# Patient Record
Sex: Female | Born: 1990 | Race: White | Hispanic: No | Marital: Single | Smoking: Former smoker
Health system: Southern US, Community
[De-identification: ages and names within clinical notes are randomized; demographics above are authoritative.]

## PROBLEM LIST (undated history)

## (undated) ENCOUNTER — Inpatient Hospital Stay: Payer: Self-pay

## (undated) DIAGNOSIS — K8020 Calculus of gallbladder without cholecystitis without obstruction: Secondary | ICD-10-CM

## (undated) DIAGNOSIS — K219 Gastro-esophageal reflux disease without esophagitis: Secondary | ICD-10-CM

## (undated) DIAGNOSIS — F329 Major depressive disorder, single episode, unspecified: Secondary | ICD-10-CM

## (undated) DIAGNOSIS — F32A Depression, unspecified: Secondary | ICD-10-CM

---

## 2011-04-30 NOTE — L&D Delivery Note (Signed)
Patient was C/C/+3 and pushed for 20 minutes with epidural.   NSVD  female infant, Apgars 8,9, weight 5#4.   The patient had 3 lacerations - two 2nd degree labial repaired with 3-0 vicryl R with cath in place  and one 2nd degree perineal repaired with 2-0 vicryl R. Fundus was firm. EBL was expected. Placenta was delivered intact. Vagina was clear.  Baby was vigorous to bedside.  Jerone Cudmore A

## 2011-06-10 LAB — OB RESULTS CONSOLE RPR: RPR: NONREACTIVE

## 2011-06-10 LAB — OB RESULTS CONSOLE GC/CHLAMYDIA: Chlamydia: NEGATIVE

## 2011-06-10 LAB — OB RESULTS CONSOLE ABO/RH: RH Type: POSITIVE

## 2011-07-13 DIAGNOSIS — K8020 Calculus of gallbladder without cholecystitis without obstruction: Secondary | ICD-10-CM | POA: Insufficient documentation

## 2012-01-05 ENCOUNTER — Encounter (HOSPITAL_COMMUNITY): Payer: Self-pay | Admitting: *Deleted

## 2012-01-05 ENCOUNTER — Inpatient Hospital Stay (HOSPITAL_COMMUNITY)
Admission: AD | Admit: 2012-01-05 | Discharge: 2012-01-05 | Disposition: A | Discharge status: Home / Self Care | Payer: Medicaid Other | Source: Ambulatory Visit | Attending: Obstetrics & Gynecology | Admitting: Obstetrics & Gynecology

## 2012-01-05 DIAGNOSIS — Z3689 Encounter for other specified antenatal screening: Secondary | ICD-10-CM

## 2012-01-05 DIAGNOSIS — M545 Low back pain, unspecified: Secondary | ICD-10-CM | POA: Insufficient documentation

## 2012-01-05 DIAGNOSIS — O368190 Decreased fetal movements, unspecified trimester, not applicable or unspecified: Secondary | ICD-10-CM | POA: Insufficient documentation

## 2012-01-05 DIAGNOSIS — O479 False labor, unspecified: Secondary | ICD-10-CM

## 2012-01-05 DIAGNOSIS — O4700 False labor before 37 completed weeks of gestation, unspecified trimester: Secondary | ICD-10-CM | POA: Insufficient documentation

## 2012-01-05 HISTORY — DX: Major depressive disorder, single episode, unspecified: F32.9

## 2012-01-05 HISTORY — DX: Calculus of gallbladder without cholecystitis without obstruction: K80.20

## 2012-01-05 HISTORY — DX: Depression, unspecified: F32.A

## 2012-01-05 LAB — URINALYSIS, ROUTINE W REFLEX MICROSCOPIC
Nitrite: NEGATIVE
Specific Gravity, Urine: 1.02 (ref 1.005–1.030)
pH: 7 (ref 5.0–8.0)

## 2012-01-05 LAB — URINE MICROSCOPIC-ADD ON

## 2012-01-05 NOTE — MAU Note (Signed)
Pt reports not feeling baby move much today. Reports having some back pain on and off all.

## 2012-01-05 NOTE — MAU Provider Note (Signed)
History     CSN: [id number]  Arrival date and time: 01/05/12 1252   First Provider Initiated Contact with Patient 01/05/12 1404      Chief Complaint  Patient presents with  . Decreased Fetal Movement   HPI 21 y.o. G1P0 at [redacted]w[redacted]d with decreased fetal movement today. Also c/o intermittent low back pain that wraps around to belly today, decreased now. No bleeding, no LOF.    Past Medical History  Diagnosis Date  . Gall stones     dx/'d right after + UPT  . Depression     took Prozac in past    Past Surgical History  Procedure Date  . No past surgeries     History reviewed. No pertinent family history.  History  Substance Use Topics  . Smoking status: Former Smoker -- 0.2 packs/day for 3 years    Types: Cigarettes    Quit date: 07/05/2011  . Smokeless tobacco: Not on file  . Alcohol Use: No     h/o EtOH use, but not now    Allergies: No Known Allergies  Prescriptions prior to admission  Medication Sig Dispense Refill  . acetaminophen (TYLENOL) 325 MG tablet Take 650 mg by mouth every 6 (six) hours as needed. For gall stone pain.      . Prenatal Vit-Fe Fumarate-FA (PRENATAL MULTIVITAMIN) TABS Take 1 tablet by mouth daily.        Review of Systems  Constitutional: Negative.   Respiratory: Negative.   Cardiovascular: Negative.   Gastrointestinal: Negative for nausea, vomiting, abdominal pain, diarrhea and constipation.  Genitourinary: Negative for dysuria, urgency, frequency, hematuria and flank pain.       Negative for vaginal bleeding, Positive cramping/contractions  Musculoskeletal: Positive for back pain.  Neurological: Negative.   Psychiatric/Behavioral: Negative.    Physical Exam   Blood pressure 122/83, pulse 77, temperature 98.1 F (36.7 C), temperature source Oral, resp. rate 18, height 5\' 2"  (1.575 m), weight 167 lb 3.2 oz (75.841 kg).  Physical Exam  Nursing note and vitals reviewed. Constitutional: She is oriented to person, place, and time. She  appears well-developed and well-nourished. No distress.  Respiratory: Effort normal.  GI: Soft. There is no tenderness.  Genitourinary:       JYN:WGNFAO/13/-0/QMVHQIONG  Musculoskeletal: Normal range of motion.  Neurological: She is alert and oriented to person, place, and time.  Skin: Skin is warm and dry.  Psychiatric: She has a normal mood and affect.    MAU Course  Procedures Results for orders placed during the hospital encounter of 01/05/12 (from the past 24 hour(s))  URINALYSIS, ROUTINE W REFLEX MICROSCOPIC     Status: Abnormal   Collection Time   01/05/12  1:08 PM      Component Value Range   Color, Urine YELLOW  YELLOW   APPearance CLEAR  CLEAR   Specific Gravity, Urine 1.020  1.005 - 1.030   pH 7.0  5.0 - 8.0   Glucose, UA NEGATIVE  NEGATIVE mg/dL   Hgb urine dipstick NEGATIVE  NEGATIVE   Bilirubin Urine NEGATIVE  NEGATIVE   Ketones, ur NEGATIVE  NEGATIVE mg/dL   Protein, ur NEGATIVE  NEGATIVE mg/dL   Urobilinogen, UA 0.2  0.0 - 1.0 mg/dL   Nitrite NEGATIVE  NEGATIVE   Leukocytes, UA SMALL (*) NEGATIVE  URINE MICROSCOPIC-ADD ON     Status: Abnormal   Collection Time   01/05/12  1:08 PM      Component Value Range   Squamous Epithelial / LPF FEW (*)  RARE   WBC, UA 3-6  <3 WBC/hpf   RBC / HPF 0-2  <3 RBC/hpf   Bacteria, UA FEW (*) RARE   NST reactive, UC q 2-5 mintues (pt is not feeling pain with these contractions)  Assessment and Plan  20 y.o. G1P0 at [redacted]w[redacted]d  Decreased fetal movement - reactive NST Preterm contractions - no evidence of active labor D/C home with precautions, f/u as scheduled or sooner PRN  Kimberly Haas 01/05/2012, 2:04 PM s

## 2012-01-05 NOTE — MAU Note (Signed)
"  I haven't felt the baby move since yesterday until one time at 2100.  I got really scared when I didn't feel anything today.  I've been having pains on and off all week, but they got really bad today."

## 2012-01-12 ENCOUNTER — Encounter (HOSPITAL_COMMUNITY): Payer: Self-pay | Admitting: *Deleted

## 2012-01-12 ENCOUNTER — Encounter (HOSPITAL_COMMUNITY): Payer: Self-pay | Admitting: Anesthesiology

## 2012-01-12 ENCOUNTER — Inpatient Hospital Stay (HOSPITAL_COMMUNITY)
Admission: AD | Admit: 2012-01-12 | Discharge: 2012-01-15 | DRG: 775 | Disposition: A | Discharge status: Home / Self Care | Payer: Medicaid Other | Source: Ambulatory Visit | Attending: Obstetrics and Gynecology | Admitting: Obstetrics and Gynecology

## 2012-01-12 ENCOUNTER — Inpatient Hospital Stay (HOSPITAL_COMMUNITY): Payer: Medicaid Other | Admitting: Anesthesiology

## 2012-01-12 DIAGNOSIS — O99892 Other specified diseases and conditions complicating childbirth: Secondary | ICD-10-CM | POA: Diagnosis present

## 2012-01-12 DIAGNOSIS — Z3480 Encounter for supervision of other normal pregnancy, unspecified trimester: Secondary | ICD-10-CM

## 2012-01-12 DIAGNOSIS — Z22330 Carrier of Group B streptococcus: Secondary | ICD-10-CM

## 2012-01-12 DIAGNOSIS — D649 Anemia, unspecified: Secondary | ICD-10-CM | POA: Diagnosis not present

## 2012-01-12 DIAGNOSIS — O4290 Premature rupture of membranes, unspecified as to length of time between rupture and onset of labor, unspecified weeks of gestation: Secondary | ICD-10-CM | POA: Diagnosis present

## 2012-01-12 DIAGNOSIS — O9903 Anemia complicating the puerperium: Secondary | ICD-10-CM | POA: Diagnosis not present

## 2012-01-12 LAB — CBC
Hemoglobin: 11.9 g/dL — ABNORMAL LOW (ref 12.0–15.0)
MCH: 31.9 pg (ref 26.0–34.0)
RBC: 3.73 MIL/uL — ABNORMAL LOW (ref 3.87–5.11)

## 2012-01-12 LAB — RPR: RPR Ser Ql: NONREACTIVE

## 2012-01-12 MED ORDER — OXYTOCIN 40 UNITS IN LACTATED RINGERS INFUSION - SIMPLE MED: 62.5000 mL/h | Freq: Once | INTRAVENOUS | Status: DC

## 2012-01-12 MED ORDER — BUTORPHANOL TARTRATE 2 MG/ML IJ SOLN
2.0000 mg | INTRAMUSCULAR | Status: DC | PRN
  Administered 2012-01-12: 2 mg via INTRAVENOUS
  Filled 2012-01-12: qty 2

## 2012-01-12 MED ORDER — IBUPROFEN 600 MG PO TABS: 600.0000 mg | ORAL_TABLET | Freq: Four times a day (QID) | ORAL | Status: DC | PRN

## 2012-01-12 MED ORDER — OXYTOCIN 40 UNITS IN LACTATED RINGERS INFUSION - SIMPLE MED
1.0000 m[IU]/min | INTRAVENOUS | Status: DC
  Administered 2012-01-12: 2 m[IU]/min via INTRAVENOUS
  Filled 2012-01-12 (×2): qty 1000

## 2012-01-12 MED ORDER — PENICILLIN G POTASSIUM 5000000 UNITS IJ SOLR
2.5000 10*6.[IU] | INTRAVENOUS | Status: DC
  Administered 2012-01-12 – 2012-01-13 (×5): 2.5 10*6.[IU] via INTRAVENOUS
  Filled 2012-01-12 (×8): qty 2.5

## 2012-01-12 MED ORDER — LACTATED RINGERS IV SOLN: INTRAVENOUS | Status: DC

## 2012-01-12 MED ORDER — DIPHENHYDRAMINE HCL 50 MG/ML IJ SOLN: 12.5000 mg | INTRAMUSCULAR | Status: DC | PRN

## 2012-01-12 MED ORDER — FENTANYL 2.5 MCG/ML BUPIVACAINE 1/10 % EPIDURAL INFUSION (WH - ANES)
14.0000 mL/h | INTRAMUSCULAR | Status: DC
  Administered 2012-01-12 – 2012-01-13 (×3): 14 mL/h via EPIDURAL
  Filled 2012-01-12 (×3): qty 60

## 2012-01-12 MED ORDER — LIDOCAINE HCL (PF) 1 % IJ SOLN
30.0000 mL | INTRAMUSCULAR | Status: DC | PRN
  Administered 2012-01-13: 30 mL via SUBCUTANEOUS
  Filled 2012-01-12: qty 30

## 2012-01-12 MED ORDER — ACETAMINOPHEN 325 MG PO TABS: 650.0000 mg | ORAL_TABLET | ORAL | Status: DC | PRN

## 2012-01-12 MED ORDER — OXYTOCIN BOLUS FROM INFUSION
500.0000 mL | Freq: Once | INTRAVENOUS | Status: DC
  Filled 2012-01-12: qty 500

## 2012-01-12 MED ORDER — PENICILLIN G POTASSIUM 5000000 UNITS IJ SOLR
5.0000 10*6.[IU] | Freq: Once | INTRAVENOUS | Status: AC
  Administered 2012-01-12: 5 10*6.[IU] via INTRAVENOUS
  Filled 2012-01-12: qty 5

## 2012-01-12 MED ORDER — PHENYLEPHRINE 40 MCG/ML (10ML) SYRINGE FOR IV PUSH (FOR BLOOD PRESSURE SUPPORT)
80.0000 ug | PREFILLED_SYRINGE | INTRAVENOUS | Status: DC | PRN
  Filled 2012-01-12: qty 5

## 2012-01-12 MED ORDER — LACTATED RINGERS IV SOLN
500.0000 mL | INTRAVENOUS | Status: DC | PRN
  Administered 2012-01-12: 500 mL via INTRAVENOUS

## 2012-01-12 MED ORDER — LIDOCAINE HCL (PF) 1 % IJ SOLN
INTRAMUSCULAR | Status: DC | PRN
  Administered 2012-01-12 (×2): 5 mL

## 2012-01-12 MED ORDER — BUTORPHANOL TARTRATE 1 MG/ML IJ SOLN
1.0000 mg | INTRAMUSCULAR | Status: DC | PRN
  Administered 2012-01-12 (×2): 1 mg via INTRAVENOUS
  Filled 2012-01-12: qty 1

## 2012-01-12 MED ORDER — OXYCODONE-ACETAMINOPHEN 5-325 MG PO TABS: 1.0000 | ORAL_TABLET | ORAL | Status: DC | PRN

## 2012-01-12 MED ORDER — ONDANSETRON HCL 4 MG/2ML IJ SOLN
4.0000 mg | Freq: Four times a day (QID) | INTRAMUSCULAR | Status: DC | PRN
  Administered 2012-01-13: 4 mg via INTRAVENOUS
  Filled 2012-01-12: qty 2

## 2012-01-12 MED ORDER — PHENYLEPHRINE 40 MCG/ML (10ML) SYRINGE FOR IV PUSH (FOR BLOOD PRESSURE SUPPORT): 80.0000 ug | PREFILLED_SYRINGE | INTRAVENOUS | Status: DC | PRN

## 2012-01-12 MED ORDER — CITRIC ACID-SODIUM CITRATE 334-500 MG/5ML PO SOLN: 30.0000 mL | ORAL | Status: DC | PRN

## 2012-01-12 MED ORDER — LACTATED RINGERS IV SOLN: 500.0000 mL | Freq: Once | INTRAVENOUS | Status: DC

## 2012-01-12 MED ORDER — TERBUTALINE SULFATE 1 MG/ML IJ SOLN: 0.2500 mg | Freq: Once | INTRAMUSCULAR | Status: AC | PRN

## 2012-01-12 MED ORDER — EPHEDRINE 5 MG/ML INJ
10.0000 mg | INTRAVENOUS | Status: DC | PRN
  Filled 2012-01-12: qty 4

## 2012-01-12 MED ORDER — EPHEDRINE 5 MG/ML INJ: 10.0000 mg | INTRAVENOUS | Status: DC | PRN

## 2012-01-12 MED ORDER — BUTORPHANOL TARTRATE 1 MG/ML IJ SOLN
INTRAMUSCULAR | Status: AC
  Filled 2012-01-12: qty 1

## 2012-01-12 NOTE — MAU Note (Signed)
Leaking fld since Weds. But not a lot. Tonight leaking more fld. No pain but some pelvic pressure.

## 2012-01-12 NOTE — Anesthesia Preprocedure Evaluation (Signed)
Anesthesia Evaluation  Patient identified by MRN, date of birth, ID band Patient awake    Reviewed: Allergy & Precautions, H&P , Patient's Chart, lab work & pertinent test results  Airway Mallampati: II TM Distance: >3 FB Neck ROM: full    Dental No notable dental hx.    Pulmonary neg pulmonary ROS,  breath sounds clear to auscultation  Pulmonary exam normal       Cardiovascular negative cardio ROS  Rhythm:regular Rate:Normal     Neuro/Psych negative neurological ROS  negative psych ROS   GI/Hepatic negative GI ROS, Neg liver ROS,   Endo/Other  negative endocrine ROS  Renal/GU negative Renal ROS     Musculoskeletal   Abdominal   Peds  Hematology negative hematology ROS (+)   Anesthesia Other Findings  Gall stones   dx/'d right after + UPT Depression   took Prozac in past   Reproductive/Obstetrics (+) Pregnancy                           Anesthesia Physical Anesthesia Plan  ASA: II  Anesthesia Plan: Epidural   Post-op Pain Management:    Induction:   Airway Management Planned:   Additional Equipment:   Intra-op Plan:   Post-operative Plan:   Informed Consent: I have reviewed the patients History and Physical, chart, labs and discussed the procedure including the risks, benefits and alternatives for the proposed anesthesia with the patient or authorized representative who has indicated his/her understanding and acceptance.     Plan Discussed with:   Anesthesia Plan Comments:         Anesthesia Quick Evaluation

## 2012-01-12 NOTE — H&P (Signed)
Pt is a 21 year old white female, G1P0, at 36 weeks who presents to ER complaining of SROM. PNC was uncomplicated. +GBS. In ER patient had a + Amniosure, +Pool,+Fern. PMHx: see [facility]. PE: VSSAF        HEENT-wnl        ABD-gravid        FHTs- reactive. IMP/ IUP at 36 weeks with SROM         +GBS Plan/ Admit          Start PCN.

## 2012-01-12 NOTE — MAU Note (Signed)
Asked pt about home situation- parents supportive and helpful.  FOB is ?(possible 2, had ended relationship and then did not get period...neither are involved.

## 2012-01-12 NOTE — MAU Provider Note (Signed)
S:  20 y.o. G1P0 @[redacted]w[redacted]d  presents to MAU with LOF x4 days.  Pt reports constant back pain, denies regular contractions or cramping.  She reports good fetal movement,  vaginal bleeding, vaginal itching/burning, urinary symptoms, h/a, dizziness, n/v, or fever/chills.    O: Patient Vitals for the past 24 hrs:  BP Temp Temp src Pulse Resp Height Weight  01/12/12 0636 106/63 mmHg - - 87  - - -  01/12/12 0626 110/64 mmHg 97.7 F (36.5 C) Oral 58  20  5\' 2"  (1.575 m) 77.282 kg (170 lb 6 oz)  01/12/12 0606 132/101 mmHg 97.8 F (36.6 C) - 67  20  5\' 2"  (1.575 m) 77.384 kg (170 lb 9.6 oz)   Speculum exam:  Positive pooling, slide taken for ferning Cervix visually closed and long  Ferning positive  FHR 125, moderate variability, positive accels, negative decels No contractions noted on toco  A:  PPROM @36 .3   P: RN to call Dr Dareen Piano Admit to birthing suites  Sharen Counter Certified Nurse-Midwife

## 2012-01-12 NOTE — Anesthesia Procedure Notes (Signed)
Epidural Patient location during procedure: OB Start time: 01/12/2012 11:37 PM  Staffing Anesthesiologist: Brayton Caves R Performed by: anesthesiologist   Preanesthetic Checklist Completed: patient identified, site marked, surgical consent, pre-op evaluation, timeout performed, IV checked, risks and benefits discussed and monitors and equipment checked  Epidural Patient position: sitting Prep: site prepped and draped and DuraPrep Patient monitoring: continuous pulse ox and blood pressure Approach: midline Injection technique: LOR air and LOR saline  Needle:  Needle type: Tuohy  Needle gauge: 17 G Needle length: 9 cm and 9 Needle insertion depth: 5 cm cm Catheter type: closed end flexible Catheter size: 19 Gauge Catheter at skin depth: 10 cm Test dose: negative  Assessment Events: blood not aspirated, injection not painful, no injection resistance, negative IV test and no paresthesia  Additional Notes Patient identified.  Risk benefits discussed including failed block, incomplete pain control, headache, nerve damage, paralysis, blood pressure changes, nausea, vomiting, reactions to medication both toxic or allergic, and postpartum back pain.  Patient expressed understanding and wished to proceed.  All questions were answered.  Sterile technique used throughout procedure and epidural site dressed with sterile barrier dressing. No paresthesia or other complications noted.The patient did not experience any signs of intravascular injection such as tinnitus or metallic taste in mouth nor signs of intrathecal spread such as rapid motor block. Please see nursing notes for vital signs.

## 2012-01-12 NOTE — Progress Notes (Signed)
[redacted]w[redacted]d with PPROM since last night per pt.  Tested positive for ROM today.  FHTs 120s gstv, NST R Toco irreg SVE forebag broken, 3/80/-2 IUPC placed.  Continue pit.

## 2012-01-13 ENCOUNTER — Encounter (HOSPITAL_COMMUNITY): Payer: Self-pay | Admitting: *Deleted

## 2012-01-13 LAB — ABO/RH: ABO/RH(D): O POS

## 2012-01-13 MED ORDER — IBUPROFEN 800 MG PO TABS
800.0000 mg | ORAL_TABLET | Freq: Three times a day (TID) | ORAL | Status: DC
  Administered 2012-01-13 – 2012-01-15 (×6): 800 mg via ORAL
  Filled 2012-01-13 (×6): qty 1

## 2012-01-13 MED ORDER — METHYLERGONOVINE MALEATE 0.2 MG/ML IJ SOLN: 0.2000 mg | INTRAMUSCULAR | Status: DC | PRN

## 2012-01-13 MED ORDER — WITCH HAZEL-GLYCERIN EX PADS: 1.0000 "application " | MEDICATED_PAD | CUTANEOUS | Status: DC | PRN

## 2012-01-13 MED ORDER — METHYLERGONOVINE MALEATE 0.2 MG/ML IJ SOLN
INTRAMUSCULAR | Status: AC
  Administered 2012-01-13: 0.2 mg
  Filled 2012-01-13: qty 1

## 2012-01-13 MED ORDER — TETANUS-DIPHTH-ACELL PERTUSSIS 5-2.5-18.5 LF-MCG/0.5 IM SUSP
0.5000 mL | Freq: Once | INTRAMUSCULAR | Status: AC
  Administered 2012-01-14: 0.5 mL via INTRAMUSCULAR
  Filled 2012-01-13: qty 0.5

## 2012-01-13 MED ORDER — FERROUS SULFATE 325 (65 FE) MG PO TABS
325.0000 mg | ORAL_TABLET | Freq: Two times a day (BID) | ORAL | Status: DC
  Administered 2012-01-13 – 2012-01-14 (×2): 325 mg via ORAL
  Filled 2012-01-13 (×2): qty 1

## 2012-01-13 MED ORDER — SODIUM CHLORIDE 0.9 % IV SOLN: 250.0000 mL | INTRAVENOUS | Status: DC | PRN

## 2012-01-13 MED ORDER — METHYLERGONOVINE MALEATE 0.2 MG PO TABS: 0.2000 mg | ORAL_TABLET | ORAL | Status: DC | PRN

## 2012-01-13 MED ORDER — BENZOCAINE-MENTHOL 20-0.5 % EX AERO
1.0000 "application " | INHALATION_SPRAY | CUTANEOUS | Status: DC | PRN
  Administered 2012-01-15: 1 via TOPICAL
  Filled 2012-01-13: qty 56

## 2012-01-13 MED ORDER — DIPHENHYDRAMINE HCL 25 MG PO CAPS: 25.0000 mg | ORAL_CAPSULE | Freq: Four times a day (QID) | ORAL | Status: DC | PRN

## 2012-01-13 MED ORDER — ZOLPIDEM TARTRATE 5 MG PO TABS: 5.0000 mg | ORAL_TABLET | Freq: Every evening | ORAL | Status: DC | PRN

## 2012-01-13 MED ORDER — DIBUCAINE 1 % RE OINT: 1.0000 "application " | TOPICAL_OINTMENT | RECTAL | Status: DC | PRN

## 2012-01-13 MED ORDER — LANOLIN HYDROUS EX OINT: TOPICAL_OINTMENT | CUTANEOUS | Status: DC | PRN

## 2012-01-13 MED ORDER — OXYCODONE-ACETAMINOPHEN 5-325 MG PO TABS
1.0000 | ORAL_TABLET | ORAL | Status: DC | PRN
  Administered 2012-01-14 – 2012-01-15 (×4): 1 via ORAL
  Filled 2012-01-13 (×4): qty 1

## 2012-01-13 MED ORDER — PRENATAL MULTIVITAMIN CH
1.0000 | ORAL_TABLET | Freq: Every day | ORAL | Status: DC
  Administered 2012-01-13 – 2012-01-15 (×3): 1 via ORAL
  Filled 2012-01-13 (×3): qty 1

## 2012-01-13 MED ORDER — SENNOSIDES-DOCUSATE SODIUM 8.6-50 MG PO TABS
2.0000 | ORAL_TABLET | Freq: Every day | ORAL | Status: DC
  Administered 2012-01-13 – 2012-01-14 (×2): 2 via ORAL

## 2012-01-13 MED ORDER — SIMETHICONE 80 MG PO CHEW: 80.0000 mg | CHEWABLE_TABLET | ORAL | Status: DC | PRN

## 2012-01-13 MED ORDER — ONDANSETRON HCL 4 MG/2ML IJ SOLN: 4.0000 mg | INTRAMUSCULAR | Status: DC | PRN

## 2012-01-13 MED ORDER — MEASLES, MUMPS & RUBELLA VAC ~~LOC~~ INJ: 0.5000 mL | INJECTION | Freq: Once | SUBCUTANEOUS | Status: DC

## 2012-01-13 MED ORDER — MAGNESIUM HYDROXIDE 400 MG/5ML PO SUSP: 30.0000 mL | ORAL | Status: DC | PRN

## 2012-01-13 MED ORDER — SODIUM CHLORIDE 0.9 % IJ SOLN: 3.0000 mL | Freq: Two times a day (BID) | INTRAMUSCULAR | Status: DC

## 2012-01-13 MED ORDER — ONDANSETRON HCL 4 MG PO TABS
4.0000 mg | ORAL_TABLET | ORAL | Status: DC | PRN
  Administered 2012-01-14: 4 mg via ORAL
  Filled 2012-01-13: qty 1

## 2012-01-13 MED ORDER — SODIUM CHLORIDE 0.9 % IJ SOLN: 3.0000 mL | INTRAMUSCULAR | Status: DC | PRN

## 2012-01-13 NOTE — Anesthesia Postprocedure Evaluation (Signed)
  Anesthesia Post-op Note  Patient: Kimberly Haas  Procedure(s) Performed: * No surgery found *  Patient Location: Mother/Baby  Anesthesia Type: Epidural  Level of Consciousness: awake and alert   Airway and Oxygen Therapy: Patient Spontanous Breathing  Post-op Pain: none  Post-op Assessment: Patient's Cardiovascular Status Stable, Respiratory Function Stable, Patent Airway, No signs of Nausea or vomiting, Adequate PO intake, Pain level controlled, No headache, No backache, No residual numbness and No residual motor weakness  Post-op Vital Signs: Reviewed and stable  Complications: No apparent anesthesia complications

## 2012-01-14 LAB — CBC
Platelets: 231 10*3/uL (ref 150–400)
RDW: 13.3 % (ref 11.5–15.5)
WBC: 15.1 10*3/uL — ABNORMAL HIGH (ref 4.0–10.5)

## 2012-01-14 MED ORDER — FERROUS SULFATE 325 (65 FE) MG PO TABS
325.0000 mg | ORAL_TABLET | Freq: Three times a day (TID) | ORAL | Status: DC
  Administered 2012-01-14 – 2012-01-15 (×4): 325 mg via ORAL
  Filled 2012-01-14 (×4): qty 1

## 2012-01-14 NOTE — Progress Notes (Signed)
UR Chart review completed.  

## 2012-01-14 NOTE — Progress Notes (Signed)
Patient is eating, ambulating, voiding.  Pain control is good. Appropriate lochia.  No N/V.  No other complaints but sore nipples.  Filed Vitals:   01/13/12 1000 01/13/12 1400 01/13/12 2130 01/14/12 0550  BP: 115/75 119/80 119/78 101/58  Pulse: 75 96 74 69  Temp:   98.1 F (36.7 C) 97.5 F (36.4 C)  TempSrc:   Oral Oral  Resp: 18 18 18 18   Height:      Weight:        Fundus firm No CT Lab Results  Component Value Date   WBC 15.1* 01/14/2012   HGB 7.6* 01/14/2012   HCT 21.6* 01/14/2012   MCV 92.7 01/14/2012   PLT 231 01/14/2012    --/--/O POS (09/15 0910)/RI  A/P Post partum day 1 Nipple pain- advised pt to continue working with lactation consultants while in hospital, possibly be fitted with nipple guards prn. Anemia - FeSO4 tid, continue PNV, colace prn for constipation.  Routine care.  Expect d/c 9/18   [place], [place]

## 2012-01-15 ENCOUNTER — Encounter (HOSPITAL_COMMUNITY): Payer: Self-pay

## 2012-01-15 MED ORDER — INFLUENZA VIRUS VACC SPLIT PF IM SUSP
0.5000 mL | Freq: Once | INTRAMUSCULAR | Status: AC
  Administered 2012-01-15: 0.5 mL via INTRAMUSCULAR
  Filled 2012-01-15: qty 0.5

## 2012-01-15 MED ORDER — OXYCODONE-ACETAMINOPHEN 5-325 MG PO TABS: 1.0000 | ORAL_TABLET | ORAL | Status: DC | PRN

## 2012-01-15 NOTE — Discharge Summary (Signed)
Obstetric Discharge Summary Reason for Admission: onset of labor and rupture of membranes Prenatal Procedures: ultrasound Intrapartum Procedures: spontaneous vaginal delivery Postpartum Procedures: none Complications-Operative and Postpartum: 2 degree perineal laceration Hemoglobin  Date Value Range Status  01/14/2012 7.6* 12.0 - 15.0 g/dL Final     DELTA CHECK NOTED     REPEATED TO VERIFY     HCT  Date Value Range Status  01/14/2012 21.6* 36.0 - 46.0 % Final    Physical Exam:  General: alert Lochia: appropriate Uterine Fundus: firm   Discharge Diagnoses: Premature SROM and Preterm labor  Discharge Information: Date: 01/15/2012 Activity: pelvic rest Diet: routine Medications: PNV, Ibuprofen, Iron and Percocet Condition: stable Instructions: refer to practice specific booklet Discharge to: home Follow-up Information    Follow up with HORVATH,MICHELLE A, MD. Schedule an appointment as soon as possible for a visit in 4 weeks.   Contact information:   [address] [place] [address] [phone]          Newborn Data: Live born female  Birth Weight: 5 lb 4 oz (2381 g) APGAR: 8, 9  Home with mother.  ANDERSON,MARK E 01/15/2012, 8:55 AM

## 2012-01-15 NOTE — Progress Notes (Signed)
PPD#2 Pt and baby are doing well. Pt ready for discharge. Plan/ Will discharge.

## 2012-02-12 ENCOUNTER — Ambulatory Visit (INDEPENDENT_AMBULATORY_CARE_PROVIDER_SITE_OTHER): Payer: Medicaid Other | Admitting: Surgery

## 2012-02-12 ENCOUNTER — Encounter (INDEPENDENT_AMBULATORY_CARE_PROVIDER_SITE_OTHER): Payer: Self-pay | Admitting: Surgery

## 2012-02-12 VITALS — BP 116/64 | HR 56 | Temp 97.1°F | Resp 16 | Ht 62.0 in | Wt 144.0 lb

## 2012-02-12 DIAGNOSIS — K8020 Calculus of gallbladder without cholecystitis without obstruction: Secondary | ICD-10-CM

## 2012-02-12 NOTE — Progress Notes (Signed)
Patient ID: Kimberly Haas, female   DOB: 06/25/1990, 21 y.o.   MRN: [id number]  Chief Complaint  Patient presents with  . Pre-op Exam    eval cholecystitis    HPI Kimberly Haas is a 21 y.o. female.  About 6 months ago, when she was about 3 months pregnant, she developed some epigastric and right upper quadrant pain and nausea. She is subsequent workup was found to have gallstones without evidence of acute cholecystitis. During her pregnancy she had a few more mild episodes but didn't have to go to the emergency room. She delivered 4 weeks ago successfully and without complication. She's continued to have some mild episodes of epigastric and right upper quadrant pain. It sometimes radiates around to her back. It is often associated with some mild nausea but so far all of the episodes have resolved spontaneously. She comes in today to seek advice about surgical intervention. HPI  Past Medical History  Diagnosis Date  . Gall stones     dx/'d right after + UPT  . Depression     took Prozac in past    Past Surgical History  Procedure Date  . No past surgeries     History reviewed. No pertinent family history.  Social History History  Substance Use Topics  . Smoking status: Former Smoker -- 0.2 packs/day for 3 years    Types: Cigarettes    Quit date: 07/05/2011  . Smokeless tobacco: Not on file  . Alcohol Use: No     h/o EtOH use, but not now    No Known Allergies  Current Outpatient Prescriptions  Medication Sig Dispense Refill  . acetaminophen (TYLENOL) 325 MG tablet Take 650 mg by mouth every 6 (six) hours as needed. For gall stone pain.      . oxyCODONE-acetaminophen (PERCOCET/ROXICET) 5-325 MG per tablet Take 1-2 tablets by mouth every 4 (four) hours as needed (moderate - severe pain).  30 tablet  0  . Prenatal Vit-Fe Fumarate-FA (PRENATAL MULTIVITAMIN) TABS Take 1 tablet by mouth daily.        Review of Systems Review of Systems  Constitutional: Negative for fever,  chills and unexpected weight change.  HENT: Negative for hearing loss, congestion, sore throat, trouble swallowing and voice change.   Eyes: Negative for visual disturbance.  Respiratory: Negative for cough and wheezing.   Cardiovascular: Negative for chest pain, palpitations and leg swelling.  Gastrointestinal: Positive for nausea, vomiting and abdominal pain. Negative for diarrhea, constipation, blood in stool, abdominal distention and anal bleeding.       As noted in HPI  Genitourinary: Negative for hematuria, vaginal bleeding and difficulty urinating.  Musculoskeletal: Negative for arthralgias.  Skin: Negative for rash and wound.  Neurological: Negative for seizures, syncope and headaches.  Hematological: Negative for adenopathy. Does not bruise/bleed easily.  Psychiatric/Behavioral: Negative for confusion.    Blood pressure 116/64, pulse 56, temperature 97.1 F (36.2 C), temperature source Temporal, resp. rate 16, height 5' 2" (1.575 m), weight 144 lb (65.318 kg), currently breastfeeding.  Physical Exam Physical Exam  Vitals reviewed. Constitutional: She is oriented to person, place, and time. She appears well-developed and well-nourished. No distress.  HENT:  Head: Normocephalic and atraumatic.  Mouth/Throat: Oropharynx is clear and moist.  Eyes: Conjunctivae normal and EOM are normal. Pupils are equal, round, and reactive to light. No scleral icterus.  Neck: Normal range of motion. Neck supple. No tracheal deviation present. No thyromegaly present.  Cardiovascular: Normal rate, regular rhythm, normal heart sounds and intact   distal pulses.  Exam reveals no gallop and no friction rub.   No murmur heard. Pulmonary/Chest: Effort normal and breath sounds normal. No respiratory distress. She has no wheezes. She has no rales.  Abdominal: Soft. Bowel sounds are normal. She exhibits no distension and no mass. There is tenderness. There is no rebound and no guarding.       Very mild RUQ  tender to deep palpation  Musculoskeletal: Normal range of motion. She exhibits no edema and no tenderness.  Neurological: She is alert and oriented to person, place, and time.  Skin: Skin is warm and dry. No rash noted. She is not diaphoretic. No erythema.  Psychiatric: She has a normal mood and affect. Her behavior is normal. Judgment and thought content normal.    Data Reviewed Reviewed notes in Epic, from primary care and sono report showing gallstones  Assessment    Symptomatic cholelithiasis    Plan    Laparoscopic cholecystectomy and operative cholangiogram is recommended The patient is agreeable to proceed. I have discussed the indications for laparoscopic cholecystectomy with her and provided educational material. We have discussed the risks of surgery, including general risks such as bleeding, infection, lung and heart issues etc. We have also discussed the potential for injuries to other organs, bile duct leaks, and other unexpected events. We have also talked about the fact that this may need to be converted to open under certain circumstances. We discussed the typical post op recovery and the fact that there is a good likelihood of improvement in symptoms and return to normal activity.  She understands this and wishes to proceed to schedule surgery. I believe all of her questions have been answered.          Kellen Dutch J 02/12/2012, 3:24 PM    

## 2012-02-12 NOTE — Patient Instructions (Signed)
We will schedule surgery to remove your gall bladder. Try to avoid fatty foods or any food that gives you indigestion

## 2012-02-27 ENCOUNTER — Encounter (HOSPITAL_BASED_OUTPATIENT_CLINIC_OR_DEPARTMENT_OTHER): Payer: Self-pay | Admitting: *Deleted

## 2012-02-27 NOTE — Pre-Procedure Instructions (Signed)
To come for UA, CBC, diff, CMET, lipase

## 2012-03-02 ENCOUNTER — Encounter (HOSPITAL_BASED_OUTPATIENT_CLINIC_OR_DEPARTMENT_OTHER)
Admission: RE | Admit: 2012-03-02 | Discharge: 2012-03-02 | Disposition: A | Discharge status: Home / Self Care | Payer: Medicaid Other | Source: Ambulatory Visit | Attending: Surgery | Admitting: Surgery

## 2012-03-02 LAB — COMPREHENSIVE METABOLIC PANEL
BUN: 8 mg/dL (ref 6–23)
CO2: 26 mEq/L (ref 19–32)
Calcium: 9.7 mg/dL (ref 8.4–10.5)
Chloride: 103 mEq/L (ref 96–112)
Creatinine, Ser: 0.61 mg/dL (ref 0.50–1.10)
GFR calc Af Amer: >90 mL/min (ref >90)
GFR calc non Af Amer: >90 mL/min (ref >90)
Glucose, Bld: 110 mg/dL — ABNORMAL HIGH (ref 70–99)
Total Bilirubin: 0.2 mg/dL — ABNORMAL LOW (ref 0.3–1.2)

## 2012-03-02 LAB — URINALYSIS, ROUTINE W REFLEX MICROSCOPIC
Glucose, UA: NEGATIVE mg/dL
Ketones, ur: NEGATIVE mg/dL
Leukocytes, UA: NEGATIVE
Protein, ur: NEGATIVE mg/dL
Urobilinogen, UA: 0.2 mg/dL (ref 0.0–1.0)

## 2012-03-02 LAB — CBC WITH DIFFERENTIAL/PLATELET
Eosinophils Relative: 1 % (ref 0–5)
HCT: 38.8 % (ref 36.0–46.0)
Hemoglobin: 13.5 g/dL (ref 12.0–15.0)
Lymphocytes Relative: 27 % (ref 12–46)
Lymphs Abs: 1.9 10*3/uL (ref 0.7–4.0)
MCV: 90 fL (ref 78.0–100.0)
Monocytes Absolute: 0.4 10*3/uL (ref 0.1–1.0)
Monocytes Relative: 6 % (ref 3–12)
Neutro Abs: 4.5 10*3/uL (ref 1.7–7.7)
RBC: 4.31 MIL/uL (ref 3.87–5.11)
RDW: 11.8 % (ref 11.5–15.5)
WBC: 6.9 10*3/uL (ref 4.0–10.5)

## 2012-03-02 NOTE — Progress Notes (Signed)
Instructed pt to shower the night before surgery with Hibiclens and am of surgery with it.

## 2012-03-05 ENCOUNTER — Ambulatory Visit (HOSPITAL_BASED_OUTPATIENT_CLINIC_OR_DEPARTMENT_OTHER): Payer: Medicaid Other | Admitting: Anesthesiology

## 2012-03-05 ENCOUNTER — Encounter (HOSPITAL_BASED_OUTPATIENT_CLINIC_OR_DEPARTMENT_OTHER): Payer: Self-pay | Admitting: Anesthesiology

## 2012-03-05 ENCOUNTER — Encounter (HOSPITAL_BASED_OUTPATIENT_CLINIC_OR_DEPARTMENT_OTHER): Payer: Self-pay | Admitting: *Deleted

## 2012-03-05 ENCOUNTER — Ambulatory Visit (HOSPITAL_BASED_OUTPATIENT_CLINIC_OR_DEPARTMENT_OTHER)
Admission: RE | Admit: 2012-03-05 | Discharge: 2012-03-06 | Disposition: A | Discharge status: Home / Self Care | Payer: Medicaid Other | Source: Ambulatory Visit | Attending: Surgery | Admitting: Surgery

## 2012-03-05 ENCOUNTER — Ambulatory Visit (HOSPITAL_COMMUNITY): Payer: Medicaid Other

## 2012-03-05 ENCOUNTER — Encounter (HOSPITAL_BASED_OUTPATIENT_CLINIC_OR_DEPARTMENT_OTHER)
Admission: RE | Disposition: A | Discharge status: Home / Self Care | Payer: Self-pay | Source: Ambulatory Visit | Attending: Surgery

## 2012-03-05 DIAGNOSIS — K8010 Calculus of gallbladder with chronic cholecystitis without obstruction: Secondary | ICD-10-CM

## 2012-03-05 DIAGNOSIS — O99893 Other specified diseases and conditions complicating puerperium: Secondary | ICD-10-CM | POA: Insufficient documentation

## 2012-03-05 DIAGNOSIS — Z01812 Encounter for preprocedural laboratory examination: Secondary | ICD-10-CM | POA: Insufficient documentation

## 2012-03-05 DIAGNOSIS — K219 Gastro-esophageal reflux disease without esophagitis: Secondary | ICD-10-CM | POA: Insufficient documentation

## 2012-03-05 DIAGNOSIS — O26899 Other specified pregnancy related conditions, unspecified trimester: Secondary | ICD-10-CM | POA: Insufficient documentation

## 2012-03-05 DIAGNOSIS — K8020 Calculus of gallbladder without cholecystitis without obstruction: Secondary | ICD-10-CM

## 2012-03-05 DIAGNOSIS — Z79899 Other long term (current) drug therapy: Secondary | ICD-10-CM | POA: Insufficient documentation

## 2012-03-05 HISTORY — DX: Gastro-esophageal reflux disease without esophagitis: K21.9

## 2012-03-05 HISTORY — PX: CHOLECYSTECTOMY: SHX55

## 2012-03-05 SURGERY — LAPAROSCOPIC CHOLECYSTECTOMY WITH INTRAOPERATIVE CHOLANGIOGRAM
Anesthesia: General | Site: Abdomen | Wound class: Contaminated

## 2012-03-05 MED ORDER — MIDAZOLAM HCL 5 MG/5ML IJ SOLN
INTRAMUSCULAR | Status: DC | PRN
  Administered 2012-03-05: 2 mg via INTRAVENOUS

## 2012-03-05 MED ORDER — LIDOCAINE HCL (CARDIAC) 10 MG/ML IV SOLN
INTRAVENOUS | Status: DC | PRN
  Administered 2012-03-05: 75 mg via INTRAVENOUS

## 2012-03-05 MED ORDER — LACTATED RINGERS IV SOLN
INTRAVENOUS | Status: DC
  Administered 2012-03-05 (×3): via INTRAVENOUS

## 2012-03-05 MED ORDER — PROMETHAZINE HCL 25 MG/ML IJ SOLN
12.5000 mg | Freq: Four times a day (QID) | INTRAMUSCULAR | Status: DC | PRN
  Administered 2012-03-05: 12.5 mg via INTRAVENOUS

## 2012-03-05 MED ORDER — OXYCODONE HCL 5 MG/5ML PO SOLN: 5.0000 mg | Freq: Once | ORAL | Status: DC | PRN

## 2012-03-05 MED ORDER — BUPIVACAINE HCL (PF) 0.25 % IJ SOLN
INTRAMUSCULAR | Status: DC | PRN
  Administered 2012-03-05: 27 mL

## 2012-03-05 MED ORDER — IOHEXOL 300 MG/ML  SOLN
INTRAMUSCULAR | Status: DC | PRN
  Administered 2012-03-05: 11:00:00

## 2012-03-05 MED ORDER — CHLORHEXIDINE GLUCONATE 4 % EX LIQD: 1.0000 "application " | Freq: Once | CUTANEOUS | Status: DC

## 2012-03-05 MED ORDER — FENTANYL CITRATE 0.05 MG/ML IJ SOLN
INTRAMUSCULAR | Status: DC | PRN
  Administered 2012-03-05 (×3): 50 ug via INTRAVENOUS

## 2012-03-05 MED ORDER — ONDANSETRON HCL 4 MG/2ML IJ SOLN
INTRAMUSCULAR | Status: DC | PRN
  Administered 2012-03-05: 4 mg via INTRAVENOUS

## 2012-03-05 MED ORDER — ROCURONIUM BROMIDE 100 MG/10ML IV SOLN
INTRAVENOUS | Status: DC | PRN
  Administered 2012-03-05: 50 mg via INTRAVENOUS

## 2012-03-05 MED ORDER — DEXAMETHASONE SODIUM PHOSPHATE 4 MG/ML IJ SOLN
INTRAMUSCULAR | Status: DC | PRN
  Administered 2012-03-05: 10 mg via INTRAVENOUS

## 2012-03-05 MED ORDER — HEPARIN SODIUM (PORCINE) 5000 UNIT/ML IJ SOLN
5000.0000 [IU] | Freq: Once | INTRAMUSCULAR | Status: AC
  Administered 2012-03-05: 5000 [IU] via SUBCUTANEOUS

## 2012-03-05 MED ORDER — OXYCODONE HCL 5 MG PO TABS: 5.0000 mg | ORAL_TABLET | Freq: Once | ORAL | Status: DC | PRN

## 2012-03-05 MED ORDER — HYDROMORPHONE HCL PF 1 MG/ML IJ SOLN
0.2500 mg | INTRAMUSCULAR | Status: DC | PRN
  Administered 2012-03-05 (×4): 0.5 mg via INTRAVENOUS

## 2012-03-05 MED ORDER — OXYCODONE-ACETAMINOPHEN 5-325 MG PO TABS
1.0000 | ORAL_TABLET | ORAL | Status: DC | PRN
  Administered 2012-03-05 – 2012-03-06 (×3): 2 via ORAL

## 2012-03-05 MED ORDER — SODIUM CHLORIDE 0.9 % IR SOLN
Status: DC | PRN
  Administered 2012-03-05: 1000 mL

## 2012-03-05 MED ORDER — CEFAZOLIN SODIUM-DEXTROSE 2-3 GM-% IV SOLR
2.0000 g | INTRAVENOUS | Status: AC
  Administered 2012-03-05: 2 g via INTRAVENOUS

## 2012-03-05 MED ORDER — ONDANSETRON HCL 4 MG/2ML IJ SOLN: 4.0000 mg | Freq: Four times a day (QID) | INTRAMUSCULAR | Status: DC | PRN

## 2012-03-05 MED ORDER — SIMETHICONE 80 MG PO CHEW
80.0000 mg | CHEWABLE_TABLET | Freq: Four times a day (QID) | ORAL | Status: DC | PRN
  Administered 2012-03-05 (×2): 80 mg via ORAL
  Filled 2012-03-05: qty 1

## 2012-03-05 MED ORDER — NEOSTIGMINE METHYLSULFATE 1 MG/ML IJ SOLN
INTRAMUSCULAR | Status: DC | PRN
  Administered 2012-03-05: 3 mg via INTRAVENOUS

## 2012-03-05 MED ORDER — PROPOFOL 10 MG/ML IV BOLUS
INTRAVENOUS | Status: DC | PRN
  Administered 2012-03-05: 200 mg via INTRAVENOUS

## 2012-03-05 MED ORDER — HYDROMORPHONE HCL PF 2 MG/ML IJ SOLN
2.0000 mg | INTRAMUSCULAR | Status: DC | PRN
  Administered 2012-03-05: 1 mg via INTRAVENOUS

## 2012-03-05 MED ORDER — GLYCOPYRROLATE 0.2 MG/ML IJ SOLN
INTRAMUSCULAR | Status: DC | PRN
  Administered 2012-03-05: 0.4 mg via INTRAVENOUS

## 2012-03-05 MED ORDER — KCL-LACTATED RINGERS-D5W 20 MEQ/L IV SOLN: INTRAVENOUS | Status: DC

## 2012-03-05 MED ORDER — KCL IN DEXTROSE-NACL 20-5-0.45 MEQ/L-%-% IV SOLN
INTRAVENOUS | Status: DC
  Administered 2012-03-05: 14:00:00 via INTRAVENOUS

## 2012-03-05 MED ORDER — HEMOSTATIC AGENTS (NO CHARGE) OPTIME
TOPICAL | Status: DC | PRN
  Administered 2012-03-05: 1 via TOPICAL

## 2012-03-05 SURGICAL SUPPLY — 39 items
APPLIER CLIP ROT 10 11.4 M/L (STAPLE) ×2
BLADE SURG ROTATE 9660 (MISCELLANEOUS) IMPLANT
CANISTER SUCTION 2500CC (MISCELLANEOUS) ×2 IMPLANT
CHLORAPREP W/TINT 26ML (MISCELLANEOUS) ×2 IMPLANT
CLIP APPLIE ROT 10 11.4 M/L (STAPLE) ×1 IMPLANT
CLOTH BEACON ORANGE TIMEOUT ST (SAFETY) ×2 IMPLANT
COVER MAYO STAND STRL (DRAPES) ×2 IMPLANT
DECANTER SPIKE VIAL GLASS SM (MISCELLANEOUS) ×2 IMPLANT
DERMABOND ADVANCED (GAUZE/BANDAGES/DRESSINGS) ×1
DERMABOND ADVANCED .7 DNX12 (GAUZE/BANDAGES/DRESSINGS) ×1 IMPLANT
DRAPE C-ARM 42X72 X-RAY (DRAPES) ×2 IMPLANT
DRAPE UTILITY 15X26 W/TAPE STR (DRAPE) ×4 IMPLANT
ELECT REM PT RETURN 9FT ADLT (ELECTROSURGICAL) ×2
ELECTRODE REM PT RTRN 9FT ADLT (ELECTROSURGICAL) ×1 IMPLANT
FILTER SMOKE EVAC LAPAROSHD (FILTER) ×2 IMPLANT
GLOVE BIO SURGEON STRL SZ 6 (GLOVE) ×2 IMPLANT
GLOVE BIO SURGEON STRL SZ 6.5 (GLOVE) ×4 IMPLANT
GLOVE ECLIPSE 6.5 STRL STRAW (GLOVE) ×2 IMPLANT
GLOVE EUDERMIC 7 POWDERFREE (GLOVE) ×2 IMPLANT
GLOVE SURG SS PI 7.0 STRL IVOR (GLOVE) ×2 IMPLANT
GOWN PREVENTION PLUS XLARGE (GOWN DISPOSABLE) ×8 IMPLANT
GOWN PREVENTION PLUS XXLARGE (GOWN DISPOSABLE) ×2 IMPLANT
GOWN STRL NON-REIN LRG LVL3 (GOWN DISPOSABLE) ×4 IMPLANT
HEMOSTAT SNOW SURGICEL 2X4 (HEMOSTASIS) ×2 IMPLANT
NS IRRIG 1000ML POUR BTL (IV SOLUTION) ×2 IMPLANT
PACK BASIN DAY SURGERY FS (CUSTOM PROCEDURE TRAY) ×2 IMPLANT
POUCH SPECIMEN RETRIEVAL 10MM (ENDOMECHANICALS) ×2 IMPLANT
SCISSORS LAP 5X35 DISP (ENDOMECHANICALS) ×2 IMPLANT
SET CHOLANGIOGRAPH 5 50 .035 (SET/KITS/TRAYS/PACK) ×2 IMPLANT
SET IRRIG TUBING LAPAROSCOPIC (IRRIGATION / IRRIGATOR) ×4 IMPLANT
SLEEVE Z-THREAD 5X100MM (TROCAR) ×2 IMPLANT
SPECIMEN JAR SMALL (MISCELLANEOUS) ×2 IMPLANT
SUT MNCRL AB 4-0 PS2 18 (SUTURE) ×2 IMPLANT
TOWEL OR 17X24 6PK STRL BLUE (TOWEL DISPOSABLE) ×2 IMPLANT
TRAY LAPAROSCOPIC (CUSTOM PROCEDURE TRAY) ×2 IMPLANT
TROCAR XCEL BLUNT TIP 100MML (ENDOMECHANICALS) ×2 IMPLANT
TROCAR Z-THREAD FIOS 11X100 BL (TROCAR) ×2 IMPLANT
TROCAR Z-THREAD FIOS 5X100MM (TROCAR) ×2 IMPLANT
WATER STERILE IRR 1000ML POUR (IV SOLUTION) IMPLANT

## 2012-03-05 NOTE — Anesthesia Procedure Notes (Signed)
Procedure Name: Intubation Date/Time: 03/05/2012 10:51 AM Performed by: Gar Gibbon Pre-anesthesia Checklist: Patient identified, Emergency Drugs available, Suction available and Patient being monitored Patient Re-evaluated:Patient Re-evaluated prior to inductionOxygen Delivery Method: Circle System Utilized Preoxygenation: Pre-oxygenation with 100% oxygen Intubation Type: IV induction Ventilation: Mask ventilation without difficulty Laryngoscope Size: Mac and 3 Grade View: Grade II Tube type: Oral Tube size: 7.0 mm Number of attempts: 1 Airway Equipment and Method: stylet and oral airway Placement Confirmation: ETT inserted through vocal cords under direct vision,  positive ETCO2 and breath sounds checked- equal and bilateral Secured at: 20 cm Tube secured with: Tape Dental Injury: Teeth and Oropharynx as per pre-operative assessment

## 2012-03-05 NOTE — Transfer of Care (Signed)
Immediate Anesthesia Transfer of Care Note  Patient: Kimberly Haas  Procedure(s) Performed: Procedure(s) (LRB) with comments: LAPAROSCOPIC CHOLECYSTECTOMY WITH INTRAOPERATIVE CHOLANGIOGRAM (N/A) - laparoscopic cholecystectomy with attempted interoperative cholangiogram  Patient Location: PACU  Anesthesia Type:General  Level of Consciousness: sedated and patient cooperative  Airway & Oxygen Therapy: Patient Spontanous Breathing and Patient connected to face mask oxygen  Post-op Assessment: Report given to PACU RN and Post -op Vital signs reviewed and stable  Post vital signs: Reviewed and stable  Complications: No apparent anesthesia complications

## 2012-03-05 NOTE — Interval H&P Note (Signed)
History and Physical Interval Note:  03/05/2012 10:27 AM  Kimberly Haas  has presented today for surgery, with the diagnosis of gallstones  The various methods of treatment have been discussed with the patient and family. After consideration of risks, benefits and other options for treatment, the patient has consented to  Procedure(s) (LRB) with comments: LAPAROSCOPIC CHOLECYSTECTOMY WITH INTRAOPERATIVE CHOLANGIOGRAM (N/A) - lap chole w/IOC as a surgical intervention .  The patient's history has been reviewed, patient examined, no change in status, stable for surgery.  I have reviewed the patient's chart and labs.  Questions were answered to the patient's satisfaction.     [name]

## 2012-03-05 NOTE — Anesthesia Postprocedure Evaluation (Signed)
Anesthesia Post Note  Patient: Kimberly Haas  Procedure(s) Performed: Procedure(s) (LRB): LAPAROSCOPIC CHOLECYSTECTOMY WITH INTRAOPERATIVE CHOLANGIOGRAM (N/A)  Anesthesia type: General  Patient location: PACU  Post pain: Pain level controlled and Adequate analgesia  Post assessment: Post-op Vital signs reviewed, Patient's Cardiovascular Status Stable, Respiratory Function Stable, Patent Airway and Pain level controlled  Last Vitals:  Filed Vitals:   03/05/12 1230  BP: 117/66  Pulse: 52  Temp:   Resp: 13    Post vital signs: Reviewed and stable  Level of consciousness: awake, alert  and oriented  Complications: No apparent anesthesia complications

## 2012-03-05 NOTE — Anesthesia Preprocedure Evaluation (Signed)
Anesthesia Evaluation  Patient identified by MRN, date of birth, ID band Patient awake    Reviewed: Allergy & Precautions, H&P , NPO status   Airway Mallampati: II  Neck ROM: full    Dental   Pulmonary          Cardiovascular     Neuro/Psych    GI/Hepatic GERD-  ,  Endo/Other    Renal/GU      Musculoskeletal   Abdominal   Peds  Hematology   Anesthesia Other Findings   Reproductive/Obstetrics                           Anesthesia Physical Anesthesia Plan  ASA: II  Anesthesia Plan: General   Post-op Pain Management:    Induction: Intravenous  Airway Management Planned: Oral ETT  Additional Equipment:   Intra-op Plan:   Post-operative Plan: Extubation in OR  Informed Consent: I have reviewed the patients History and Physical, chart, labs and discussed the procedure including the risks, benefits and alternatives for the proposed anesthesia with the patient or authorized representative who has indicated his/her understanding and acceptance.     Plan Discussed with: CRNA and Surgeon  Anesthesia Plan Comments:         Anesthesia Quick Evaluation

## 2012-03-05 NOTE — H&P (View-Only) (Signed)
Patient ID: Kimberly Haas, female   DOB: 03/04/1991, 21 y.o.   MRN: [id number]  Chief Complaint  Patient presents with  . Pre-op Exam    eval cholecystitis    HPI Kimberly Haas is a 21 y.o. female.  About 6 months ago, when she was about 3 months pregnant, she developed some epigastric and right upper quadrant pain and nausea. She is subsequent workup was found to have gallstones without evidence of acute cholecystitis. During her pregnancy she had a few more mild episodes but didn't have to go to the emergency room. She delivered 4 weeks ago successfully and without complication. She's continued to have some mild episodes of epigastric and right upper quadrant pain. It sometimes radiates around to her back. It is often associated with some mild nausea but so far all of the episodes have resolved spontaneously. She comes in today to seek advice about surgical intervention. HPI  Past Medical History  Diagnosis Date  . Gall stones     dx/'d right after + UPT  . Depression     took Prozac in past    Past Surgical History  Procedure Date  . No past surgeries     History reviewed. No pertinent family history.  Social History History  Substance Use Topics  . Smoking status: Former Smoker -- 0.2 packs/day for 3 years    Types: Cigarettes    Quit date: 07/05/2011  . Smokeless tobacco: Not on file  . Alcohol Use: No     h/o EtOH use, but not now    No Known Allergies  Current Outpatient Prescriptions  Medication Sig Dispense Refill  . acetaminophen (TYLENOL) 325 MG tablet Take 650 mg by mouth every 6 (six) hours as needed. For gall stone pain.      Marland Kitchen oxyCODONE-acetaminophen (PERCOCET/ROXICET) 5-325 MG per tablet Take 1-2 tablets by mouth every 4 (four) hours as needed (moderate - severe pain).  30 tablet  0  . Prenatal Vit-Fe Fumarate-FA (PRENATAL MULTIVITAMIN) TABS Take 1 tablet by mouth daily.        Review of Systems Review of Systems  Constitutional: Negative for fever,  chills and unexpected weight change.  HENT: Negative for hearing loss, congestion, sore throat, trouble swallowing and voice change.   Eyes: Negative for visual disturbance.  Respiratory: Negative for cough and wheezing.   Cardiovascular: Negative for chest pain, palpitations and leg swelling.  Gastrointestinal: Positive for nausea, vomiting and abdominal pain. Negative for diarrhea, constipation, blood in stool, abdominal distention and anal bleeding.       As noted in HPI  Genitourinary: Negative for hematuria, vaginal bleeding and difficulty urinating.  Musculoskeletal: Negative for arthralgias.  Skin: Negative for rash and wound.  Neurological: Negative for seizures, syncope and headaches.  Hematological: Negative for adenopathy. Does not bruise/bleed easily.  Psychiatric/Behavioral: Negative for confusion.    Blood pressure 116/64, pulse 56, temperature 97.1 F (36.2 C), temperature source Temporal, resp. rate 16, height 5\' 2"  (1.575 m), weight 144 lb (65.318 kg), currently breastfeeding.  Physical Exam Physical Exam  Vitals reviewed. Constitutional: She is oriented to person, place, and time. She appears well-developed and well-nourished. No distress.  HENT:  Head: Normocephalic and atraumatic.  Mouth/Throat: Oropharynx is clear and moist.  Eyes: Conjunctivae normal and EOM are normal. Pupils are equal, round, and reactive to light. No scleral icterus.  Neck: Normal range of motion. Neck supple. No tracheal deviation present. No thyromegaly present.  Cardiovascular: Normal rate, regular rhythm, normal heart sounds and intact  distal pulses.  Exam reveals no gallop and no friction rub.   No murmur heard. Pulmonary/Chest: Effort normal and breath sounds normal. No respiratory distress. She has no wheezes. She has no rales.  Abdominal: Soft. Bowel sounds are normal. She exhibits no distension and no mass. There is tenderness. There is no rebound and no guarding.       Very mild RUQ  tender to deep palpation  Musculoskeletal: Normal range of motion. She exhibits no edema and no tenderness.  Neurological: She is alert and oriented to person, place, and time.  Skin: Skin is warm and dry. No rash noted. She is not diaphoretic. No erythema.  Psychiatric: She has a normal mood and affect. Her behavior is normal. Judgment and thought content normal.    Data Reviewed Reviewed notes in Epic, from primary care and sono report showing gallstones  Assessment    Symptomatic cholelithiasis    Plan    Laparoscopic cholecystectomy and operative cholangiogram is recommended The patient is agreeable to proceed. I have discussed the indications for laparoscopic cholecystectomy with her and provided educational material. We have discussed the risks of surgery, including general risks such as bleeding, infection, lung and heart issues etc. We have also discussed the potential for injuries to other organs, bile duct leaks, and other unexpected events. We have also talked about the fact that this may need to be converted to open under certain circumstances. We discussed the typical post op recovery and the fact that there is a good likelihood of improvement in symptoms and return to normal activity.  She understands this and wishes to proceed to schedule surgery. I believe all of her questions have been answered.          Kimberly Haas J 02/12/2012, 3:24 PM

## 2012-03-05 NOTE — Op Note (Signed)
Kimberly Haas [birth year] [id number] 02/12/2012  Preoperative diagnosis: chronic calculus cystitis  Postoperative diagnosis: the same  Procedure: laparoscopic cholecystectomy  Surgeon: Currie Paris, MD, FACS  Assistant surgeon: Dr. Almond Lint   Anesthesia: General  Clinical History and Indications: This patient has known gallstones and comes in today for cholecystectomy.  Description of procedure: The patient was seen in the preoperative area. I reviewed the plans for the procedure with her as well as the risks and complications. She had no further questions and wished to proceed.  The patient was taken to the operating room. After satisfactory general endotracheal anesthesia had been obtained the abdomen was prepped and draped. A time out was done.  0.25% plain Marcaine was used at all incisions. I made an umbilical incision, identified the fascia and opened that, and entered the peritoneal cavity under direct vision. A 0 Vicryl pursestring suture was placed and the Hasson cannula was introduced under direct vision and secured with the pursestring. The abdomen was inflated to 15 cm.  The camera was placed and there were no gross abnormalities. The patient was then placed in reverse Trendelenburg and tilted to the left. A 10/11 trocar was placed in the epigastrium and two 5 mm trochars placed laterally all under direct vision.  The gallbladder was thin-walled. I was able to open the peritoneum and dissect out a long segment of cystic artery and cystic duct. I clipped the cystic artery 3 times and one clip on the cystic duct at the junction of the gallbladder. A Cook catheter was placed percutaneously. I attempted an operative pancreas but unfortunately we couldn't get through and through the back wall cystic duct and was unable to successfully complete the cholangiogram. However the cystic duct was extremely tiny and I can see the anatomy clearly saw for we will omit the atrium. I  did put 3 clips on the cystic duct such that they were distal to the opening in the posterior wall of cystic duct. The duct was then divided. The gallbladder was removed from below to above with coagulation current of the cautery. This placed today. I copiously irrigated to make sure everything was dry. I put some SNOW the gallbladder bed.  The abdomen was irrigated and a check for hemostasis along the bed of the gallbladder made. Once everything appeared to be dry we were able to move the camera to the epigastric port and removed the gallbladder through the umbilical port.  The abdomen was reinsufflated and a final check for hemostasis made. There is no evidence of bleeding or bile leakage. The lateral ports were removed under direct vision and there was no bleeding. The umbilical site was closed with a pursestring, watching with the camera in the epigastric port. The abdomen was then deflated through the epigastric port and that was removed. Skin was closed with 4-0 Monocryl subcuticular and Dermabond.  The patient tolerated the procedure well. There were no operative complications. EBL was minimal. All counts were correct.  Currie Paris, MD, FACS 03/05/2012 11:55 AM

## 2012-03-06 ENCOUNTER — Encounter (HOSPITAL_BASED_OUTPATIENT_CLINIC_OR_DEPARTMENT_OTHER): Payer: Self-pay | Admitting: Surgery

## 2012-03-06 MED ORDER — OXYCODONE-ACETAMINOPHEN 5-325 MG PO TABS: 1.0000 | ORAL_TABLET | ORAL | Status: DC | PRN

## 2012-03-06 NOTE — Progress Notes (Signed)
1 Day Post-Op   Assessment: s/p Procedure(s): LAPAROSCOPIC CHOLECYSTECTOMY WITH INTRAOPERATIVE CHOLANGIOGRAM Patient Active Problem List  Diagnosis  . Gallstones    Doing well post op and able to go home  Plan: Discharge  Subjective: Had a good night, no nausea, took only two pain pills. Had some "gas" pain early post op, now resolved  Objective: Vital signs in last 24 hours: Temp:  [97.6 F (36.4 C)-98.4 F (36.9 C)] 97.8 F (36.6 C) (11/08 0315) Pulse Rate:  [52-83] 78  (11/08 0315) Resp:  [10-22] 16  (11/08 0315) BP: (93-127)/(52-80) 103/52 mmHg (11/08 0315) SpO2:  [95 %-100 %] 98 % (11/08 0315) Weight:  [145 lb 4 oz (65.885 kg)] 145 lb 4 oz (65.885 kg) (11/07 0935)   Intake/Output from previous day: 11/07 0701 - 11/08 0700 In: 1800 [P.O.:900; I.V.:900] Out: 1900 [Urine:1900] Intake/Output this shift:     General appearance: alert, cooperative and no distress Resp: clear to auscultation bilaterally GI: soft, mild tender c/w post op, no rebound, BS +  Incision: healing well  Lab Results:  No results found for this basename: WBC:2,HGB:2,HCT:2,PLT:2 in the last 72 hours BMET No results found for this basename: NA:2,K:2,CL:2,CO2:2,GLUCOSE:2,BUN:2,CREATININE:2,CALCIUM:2 in the last 72 hours PT/INR No results found for this basename: LABPROT:2,INR:2 in the last 72 hours ABG No results found for this basename: PHART:2,PCO2:2,PO2:2,HCO3:2 in the last 72 hours  MEDS, Scheduled    . [COMPLETED]  ceFAZolin (ANCEF) IV  2 g Intravenous On Call to OR  . [COMPLETED] heparin  5,000 Units Subcutaneous Once  . [DISCONTINUED] chlorhexidine  1 application Topical Once  . [DISCONTINUED] chlorhexidine  1 application Topical Once    Studies/Results: Dg C-arm 1-60 Min-no Report  03/05/2012  CLINICAL DATA: gallstones, laparoscopic cholesystectomy   C-ARM 1-60 MINUTES  Fluoroscopy was utilized by the requesting physician.  No radiographic  interpretation.        LOS: 1  day     Currie Paris, MD, [facility], [place] [phone]   03/06/2012 7:21 AM

## 2012-03-20 ENCOUNTER — Encounter (INDEPENDENT_AMBULATORY_CARE_PROVIDER_SITE_OTHER): Payer: Self-pay | Admitting: Surgery

## 2012-03-20 ENCOUNTER — Ambulatory Visit (INDEPENDENT_AMBULATORY_CARE_PROVIDER_SITE_OTHER): Payer: Medicaid Other | Admitting: Surgery

## 2012-03-20 VITALS — BP 126/62 | HR 60 | Temp 98.0°F | Resp 18 | Ht 62.0 in | Wt 144.8 lb

## 2012-03-20 DIAGNOSIS — Z09 Encounter for follow-up examination after completed treatment for conditions other than malignant neoplasm: Secondary | ICD-10-CM

## 2012-03-20 DIAGNOSIS — K8020 Calculus of gallbladder without cholecystitis without obstruction: Secondary | ICD-10-CM

## 2012-03-20 NOTE — Progress Notes (Signed)
NAME: Kimberly Haas       DOB: 11-13-1990           DATE: 03/20/2012       ZOX:[id number]   CC: Postop laparoscopic cholecystectomy  Impression:  The patient appears to be doing well, with improvement in her symptoms.  Plan:  She may resume full activity and regular diet. She  will followup with [place] on a p.r.n. basis. I did tell her that she may still have some foods that cause indigestion and ask her to call us if there are any questions, problems or concerns.  HPI:  This patient underwent a laparoscopic cholecystectomy without operative cholangiogram on 03/05/2012. She is in for her first postoperative visit. She notes that her incisional pain has resolved. Her preoperative symptoms have improved. She is not having problems with nausea, vomiting, diarrhea, fevers, chills, or urinary symptoms. She is tolerating diet. She feels that she is progressing well and nearly back to normal. PE:  VS: BP 126/62  Pulse 60  Temp 98 F (36.7 C) (Temporal)  Resp 18  Ht 5\' 2"  (1.575 m)  Wt 144 lb 12.8 oz (65.681 kg)  BMI 26.48 kg/m2  General: The patient is alert and appears comfortable, NAD.  Abdomen: Soft and benign. The incisions are healing nicely. There are no apparent problems.  Data reviewed:  Pathology: Diagnosis Gallbladder - CHRONIC CHOLECYSTITIS AND CHOLELITHIASIS. Jimmy Picket MD Pathologist, Electronic Signature (Case signed 03/06/2012)

## 2012-03-20 NOTE — Patient Instructions (Signed)
You may resume normal activities. We will see you again on an as needed basis. Please call the office at [phone] if you have any questions or concerns. Thank you for allowing us to take care of you.  

## 2012-04-15 ENCOUNTER — Encounter (INDEPENDENT_AMBULATORY_CARE_PROVIDER_SITE_OTHER): Payer: Self-pay

## 2014-02-28 ENCOUNTER — Encounter (INDEPENDENT_AMBULATORY_CARE_PROVIDER_SITE_OTHER): Payer: Self-pay | Admitting: Surgery

## 2017-10-23 ENCOUNTER — Ambulatory Visit (INDEPENDENT_AMBULATORY_CARE_PROVIDER_SITE_OTHER): Payer: Medicaid Other | Admitting: Advanced Practice Midwife

## 2017-10-23 ENCOUNTER — Encounter: Payer: Self-pay | Admitting: Advanced Practice Midwife

## 2017-10-23 VITALS — BP 110/72 | HR 91 | Wt 169.0 lb

## 2017-10-23 DIAGNOSIS — O99210 Obesity complicating pregnancy, unspecified trimester: Secondary | ICD-10-CM

## 2017-10-23 DIAGNOSIS — O0990 Supervision of high risk pregnancy, unspecified, unspecified trimester: Secondary | ICD-10-CM | POA: Insufficient documentation

## 2017-10-23 DIAGNOSIS — N644 Mastodynia: Secondary | ICD-10-CM

## 2017-10-23 DIAGNOSIS — O219 Vomiting of pregnancy, unspecified: Secondary | ICD-10-CM

## 2017-10-23 DIAGNOSIS — O99211 Obesity complicating pregnancy, first trimester: Secondary | ICD-10-CM

## 2017-10-23 DIAGNOSIS — F32A Depression, unspecified: Secondary | ICD-10-CM

## 2017-10-23 DIAGNOSIS — F17210 Nicotine dependence, cigarettes, uncomplicated: Secondary | ICD-10-CM

## 2017-10-23 DIAGNOSIS — O99341 Other mental disorders complicating pregnancy, first trimester: Secondary | ICD-10-CM

## 2017-10-23 DIAGNOSIS — Z113 Encounter for screening for infections with a predominantly sexual mode of transmission: Secondary | ICD-10-CM

## 2017-10-23 DIAGNOSIS — Z6832 Body mass index (BMI) 32.0-32.9, adult: Secondary | ICD-10-CM

## 2017-10-23 DIAGNOSIS — O09291 Supervision of pregnancy with other poor reproductive or obstetric history, first trimester: Secondary | ICD-10-CM

## 2017-10-23 DIAGNOSIS — O99340 Other mental disorders complicating pregnancy, unspecified trimester: Secondary | ICD-10-CM

## 2017-10-23 DIAGNOSIS — Z3A01 Less than 8 weeks gestation of pregnancy: Secondary | ICD-10-CM

## 2017-10-23 DIAGNOSIS — Z3201 Encounter for pregnancy test, result positive: Secondary | ICD-10-CM | POA: Diagnosis not present

## 2017-10-23 DIAGNOSIS — O10911 Unspecified pre-existing hypertension complicating pregnancy, first trimester: Secondary | ICD-10-CM

## 2017-10-23 DIAGNOSIS — F329 Major depressive disorder, single episode, unspecified: Secondary | ICD-10-CM | POA: Insufficient documentation

## 2017-10-23 DIAGNOSIS — R5383 Other fatigue: Secondary | ICD-10-CM

## 2017-10-23 DIAGNOSIS — O99331 Smoking (tobacco) complicating pregnancy, first trimester: Secondary | ICD-10-CM

## 2017-10-23 DIAGNOSIS — N926 Irregular menstruation, unspecified: Secondary | ICD-10-CM

## 2017-10-23 DIAGNOSIS — Z131 Encounter for screening for diabetes mellitus: Secondary | ICD-10-CM

## 2017-10-23 DIAGNOSIS — F418 Other specified anxiety disorders: Secondary | ICD-10-CM

## 2017-10-23 DIAGNOSIS — O10919 Unspecified pre-existing hypertension complicating pregnancy, unspecified trimester: Secondary | ICD-10-CM | POA: Insufficient documentation

## 2017-10-23 LAB — OB RESULTS CONSOLE GC/CHLAMYDIA
Chlamydia: NEGATIVE
Gonorrhea: NEGATIVE

## 2017-10-23 LAB — POCT URINE PREGNANCY: PREG TEST UR: POSITIVE — AB

## 2017-10-23 NOTE — Progress Notes (Signed)
New Obstetric Patient H&P    Chief Complaint: "Desires prenatal care"   History of Present Illness: Patient is a 27 y.o. G2P0101 Not Hispanic or Latino female, presents with amenorrhea and positive home pregnancy test. LMP: 09/08/2017. Based on her  LMP, her EDD is Estimated Date of Delivery: 06/15/2018. and her EGA is 6 weeks and 3 days. Cycles are 5. days, regular, and occur approximately every : 14 days. Her last pap smear was 1 month ago and was ASCUS with NEGATIVE high risk HPV.   She had her Nexplanon taken out in April of this year and had not started any other birth control. She was not planning the pregnancy.   She had a urine pregnancy test which was positive 3 week(s)  ago. Her last menstrual period was normal and lasted for  5 day(s). Since her LMP she claims she has experienced breast tenderness, fatigue, nausea, vomiting. She denies vaginal bleeding. Her past medical history is contributory for cholecystectomy, anxiety/depression, chronic hypertension. Her prior pregnancies are notable for preterm delivery following PPROM at 35 weeks in 2013.  Since her LMP, she admits to the use of tobacco products  Yes she smokes 3 cigarettes per day and is trying to quit She claims she has lost  12 pounds since the start of her pregnancy.  There are cats in the home in the home  no  She admits close contact with children on a regular basis  yes  She has had chicken pox in the past yes She has had Tuberculosis exposure, symptoms, or previously tested positive for TB   no Current or past history of domestic violence. no  Genetic Screening/Teratology Counseling: (Includes patient, baby's father, or anyone in either family with:)   1. [facility]= 7735 [facility]  no 2. Thalassemia ([place], [place], Mediterranean, or Asian background): MCV<80  no 3. Neural tube defect (meningomyelocele, spina bifida, anencephaly)  no 4. Congenital heart defect  no  5. Down syndrome  no 6. Tay-Sachs (Jewish,  [place] Canadian)  no 7. Canavan's Disease  no 8. Sickle cell disease or trait (African)  no  9. Hemophilia or other blood disorders  no  10. Muscular dystrophy  no  11. Cystic fibrosis  no  12. Huntington's Chorea  no  13. Mental retardation/autism  no 14. Other inherited genetic or chromosomal disorder  no 15. Maternal metabolic disorder (DM, PKU, etc)  no 16. Patient or FOB with a child with a birth defect not listed above no  16a. Patient or FOB with a birth defect themselves no 17. Recurrent pregnancy loss, or stillbirth  no  18. Any medications since LMP other than prenatal vitamins (include vitamins, supplements, OTC meds, drugs, alcohol)  Yes- she takes zoloft for anxiety and depression, and she takes metoprolol for chronic hypertension 19. Any other genetic/environmental exposure to discuss  no  Infection History:   1. Lives with someone with TB or TB exposed  no  2. Patient or partner has history of genital herpes  no 3. Rash or viral illness since LMP  no 4. History of STI (GC, CT, HPV, syphilis, HIV)  no 5. History of recent travel :  no  Other pertinent information:  no    Review of Systems:10 point review of systems negative unless otherwise noted in HPI  Past Medical History:  Past Medical History:  Diagnosis Date  . Acid reflux    occasional; TUMS as needed  . Depression    no current meds.  .Marland Kitchen  Gall stones   . Lactating mother    instructed to pump and discard breast milk x 24 hours post-op    Past Surgical History:  Past Surgical History:  Procedure Laterality Date  . CHOLECYSTECTOMY  03/05/2012   Procedure: LAPAROSCOPIC CHOLECYSTECTOMY WITH INTRAOPERATIVE CHOLANGIOGRAM;  Surgeon: Currie Paris, MD;  Location: [facility];  Service: General;  Laterality: N/A;  laparoscopic cholecystectomy with attempted interoperative cholangiogram  . NO PAST SURGERIES      Gynecologic History: No LMP recorded. Patient is pregnant.  Obstetric  History: G2P0101  Family History:  History reviewed. No pertinent family history.  Social History:  Social History   Socioeconomic History  . Marital status: Single    Spouse name: Not on file  . Number of children: Not on file  . Years of education: Not on file  . Highest education level: Not on file  Occupational History  . Occupation: CSR    Employer: OTHER    Comment: Pensions consultant  Social Needs  . Financial resource strain: Not on file  . Food insecurity:    Worry: Not on file    Inability: Not on file  . Transportation needs:    Medical: Not on file    Non-medical: Not on file  Tobacco Use  . Smoking status: Current Every Day Smoker    Packs/day: 0.15    Years: 8.00    Pack years: 1.20    Types: Cigarettes    Last attempt to quit: 07/05/2011    Years since quitting: 6.3  . Smokeless tobacco: Never Used  Substance and Sexual Activity  . Alcohol use: No    Comment: h/o EtOH use, but not now  . Drug use: No  . Sexual activity: Yes    Birth control/protection: None  Lifestyle  . Physical activity:    Days per week: Not on file    Minutes per session: Not on file  . Stress: Not on file  Relationships  . Social connections:    Talks on phone: Not on file    Gets together: Not on file    Attends religious service: Not on file    Active member of club or organization: Not on file    Attends meetings of clubs or organizations: Not on file    Relationship status: Not on file  . Intimate partner violence:    Fear of current or ex partner: Not on file    Emotionally abused: Not on file    Physically abused: Not on file    Forced sexual activity: Not on file  Other Topics Concern  . Not on file  Social History Narrative  . Not on file    Allergies:  No Known Allergies  Medications: Prior to Admission medications   Medication Sig Start Date End Date Taking? Authorizing Provider  sertraline (ZOLOFT) 50 MG tablet Take 1 tablet by mouth daily. 07/09/17  07/09/18 Yes [provider]  metoprolol succinate (TOPROL-XL) 25 MG 24 hr tablet Take 25 mg by mouth daily. 10/05/17   [provider]    Physical Exam Vitals: Blood pressure 110/72, pulse 91, weight 169 lb (76.7 kg).  General: NAD HEENT: normocephalic, anicteric Thyroid: no enlargement, no palpable nodules Pulmonary: No increased work of breathing, CTAB Cardiovascular: RRR, distal pulses 2+ Abdomen: NABS, soft, non-tender, non-distended.  Umbilicus without lesions.  No hepatomegaly, splenomegaly or masses palpable. No evidence of hernia  Genitourinary: deferred for recent PAP smear and no concerns Extremities: no edema, erythema,  or tenderness Neurologic: Grossly intact Psychiatric: mood appropriate, affect full   Assessment: 27 y.o. G2P0101 at Unknown presenting to initiate prenatal care  Plan: 1) Avoid alcoholic beverages. 2) Patient encouraged not to smoke.  3) Discontinue the use of all non-medicinal drugs and chemicals.  4) Take prenatal vitamins daily.  5) Nutrition, food safety (fish, cheese advisories, and high nitrite foods) and exercise discussed. 6) Hospital and practice style discussed with cross coverage system.  7) Genetic Screening, such as with 1st Trimester Screening, cell free fetal DNA, AFP testing, and Ultrasound, as well as with amniocentesis and CVS as appropriate, is discussed with patient. At the conclusion of today's visit patient declined genetic testing 8) Patient is asked about travel to areas at risk for the [place] virus, and counseled to avoid travel and exposure to mosquitoes or sexual partners who may have themselves been exposed to the virus. Testing is discussed, and will be ordered as appropriate.    Tresea Mall, CNM [facility], [facility] 10/23/2017, 11:26 AM

## 2017-10-23 NOTE — Patient Instructions (Addendum)
Exercise During Pregnancy For people of all ages, exercise is an important part of being healthy. Exercise improves heart and lung function and helps to maintain strength, flexibility, and a healthy body weight. Exercise also boosts energy levels and elevates mood. For most women, maintaining an exercise routine throughout pregnancy is recommended. It is only on rare occasions and with certain medical conditions or pregnancy complications that women may be asked to limit or avoid exercise during pregnancy. What are some other benefits to exercising during pregnancy? Along with maintaining strength and flexibility, exercising throughout pregnancy can help to:  Keep strength in muscles that are very important during labor and childbirth.  Decrease low back pain during pregnancy.  Decrease the risk of developing gestational diabetes mellitus (GDM).  Improve blood sugar (glucose) control for women who have GDM.  Decrease the risk of developing preeclampsia. This is a serious condition that causes high blood pressure along with other symptoms, such as swelling and headaches.  Decrease the risk of cesarean delivery.  Speed up the recovery after giving birth.  How often should I exercise? Unless your health care provider gives you different instructions, you should try to exercise on most days or all days of the week. In general, try to exercise with moderate intensity for about 150 minutes per week. This can be spread out across several days, such as exercising for 30 minutes per day on 5 days of each week. You can tell that you are exercising at a moderate intensity if you have a higher heart rate and faster breathing, but you are still able to hold a conversation. What types of moderate-intensity exercise are recommended during pregnancy? There are many types of exercise that are safe for you to do during pregnancy. Unless your health care provider gives you different instructions, do a variety of  exercises that safely increase your heart and breathing (cardiopulmonary) rates and help you to build and maintain muscle strength (strength training). You should always be able to talk in full sentences while exercising during pregnancy. Some examples of exercising that is safe to do during pregnancy include:  Brisk walking or hiking.  Swimming.  Water aerobics.  Riding a stationary bike.  Strength training.  Modified yoga or Pilates. Tell your instructor that you are pregnant. Avoid overstretching and avoid lying on your back for long periods of time.  Running or jogging. Only choose this type of exercise if: ? You ran or jogged regularly before your pregnancy. ? You can run or jog and still talk in complete sentences.  What types of exercise should I not do during pregnancy? Depending on your level of fitness and whether you exercised regularly before your pregnancy, you may be advised to limit vigorous-intensity exercise during your pregnancy. You can tell that you are exercising at a vigorous intensity if you are breathing much harder and faster and cannot hold a conversation while exercising. Some examples of exercising that you should avoid during pregnancy include:  Contact sports.  Activities that place you at risk for falling on or being hit in the belly, such as downhill skiing, water skiing, surfing, rock climbing, cycling, gymnastics, and horseback riding.  Scuba diving.  Sky diving.  Yoga or Pilates in a room that is heated to extreme temperatures ("hot yoga" or "hot Pilates").  Jogging or running, unless you ran or jogged regularly before your pregnancy. While jogging or running, you should always be able to talk in full sentences. Do not run or jog so vigorously   that you are unable to have a conversation.  If you are not used to exercising at elevation (more than 6,000 feet above sea level), do not do so during your pregnancy.  When should I avoid exercising  during pregnancy? Certain medical conditions can make it unsafe to exercise during pregnancy, or they may increase your risk of miscarriage or early labor and birth. Some of these conditions include:  Some types of heart disease.  Some types of lung disease.  Placenta previa. This is when the placenta partially or completely covers the opening of the uterus (cervix).  Frequent bleeding from the vagina during your pregnancy.  Incompetent cervix. This is when your cervix does not remain as tightly closed during pregnancy as it should.  Premature labor.  Ruptured membranes. This is when the protective sac (amniotic sac) opens up and amniotic fluid leaks from your vagina.  Severely low blood count (anemia).  Preeclampsia or pregnancy-caused high blood pressure.  Carrying more than one baby (multiple gestation) and having an additional risk of early labor.  Poorly controlled diabetes.  Being severely underweight or severely overweight.  Intrauterine growth restriction. This is when your baby's growth and development during pregnancy are slower than expected.  Other medical conditions. Ask your health care provider if any apply to you.  What else should I know about exercising during pregnancy? You should take these precautions while exercising during pregnancy:  Avoid overheating. ? Wear loose-fitting, breathable clothes. ? Do not exercise in very high temperatures.  Avoid dehydration. Drink enough water before, during, and after exercise to keep your urine clear or pale yellow.  Avoid overstretching. Because of hormone changes during pregnancy, it is easy to overstretch muscles, tendons, and ligaments during pregnancy.  Start slowly and ask your health care provider to recommend types of exercise that are safe for you, if exercising regularly is new for you.  Pregnancy is not a time for exercising to lose weight. When should I seek medical care? You should stop exercising  and call your health care provider if you have any unusual symptoms, such as:  Mild uterine contractions or abdominal cramping.  Dizziness that does not improve with rest.  When should I seek immediate medical care? You should stop exercising and call your local emergency services (911 in the [place] if you have any unusual symptoms, such as:  Sudden, severe pain in your low back or your belly.  Uterine contractions or abdominal cramping that do not improve with rest.  Chest pain.  Bleeding or fluid leaking from your vagina.  Shortness of breath.  This information is not intended to replace advice given to you by your health care provider. Make sure you discuss any questions you have with your health care provider. Document Released: 04/15/2005 Document Revised: 09/13/2015 Document Reviewed: 06/23/2014 Elsevier Interactive Patient Education  2018 [employer] Eating Plan for Pregnant Women While you are pregnant, your body will require additional nutrition to help support your growing baby. It is recommended that you consume:  150 additional calories each day during your first trimester.  300 additional calories each day during your second trimester.  300 additional calories each day during your third trimester.  Eating a healthy, well-balanced diet is very important for your health and for your baby's health. You also have a higher need for some vitamins and minerals, such as folic acid, calcium, iron, and vitamin D. What do I need to know about eating during pregnancy?  Do not try to lose weight   or go on a diet during pregnancy.  Choose healthy, nutritious foods. Choose  of a sandwich with a glass of milk instead of a candy bar or a high-calorie sugar-sweetened beverage.  Limit your overall intake of foods that have "empty calories." These are foods that have little nutritional value, such as sweets, desserts, candies, sugar-sweetened beverages, and fried foods.  Eat a  variety of foods, especially fruits and vegetables.  Take a prenatal vitamin to help meet the additional needs during pregnancy, specifically for folic acid, iron, calcium, and vitamin D.  Remember to stay active. Ask your health care provider for exercise recommendations that are specific to you.  Practice good food safety and cleanliness, such as washing your hands before you eat and after you prepare raw meat. This helps to prevent foodborne illnesses, such as listeriosis, that can be very dangerous for your baby. Ask your health care provider for more information about listeriosis. What does 150 extra calories look like? Healthy options for an additional 150 calories each day could be any of the following:  Plain low-fat yogurt (6-8 oz) with  cup of berries.  1 apple with 2 teaspoons of peanut butter.  Cut-up vegetables with  cup of hummus.  Low-fat chocolate milk (8 oz or 1 cup).  1 string cheese with 1 medium orange.   of a peanut butter and jelly sandwich on whole-wheat bread (1 tsp of peanut butter).  For 300 calories, you could eat two of those healthy options each day. What is a healthy amount of weight to gain? The recommended amount of weight for you to gain is based on your pre-pregnancy BMI. If your pre-pregnancy BMI was:  Less than 18 (underweight), you should gain 28-40 lb.  18-24.9 (normal), you should gain 25-35 lb.  25-29.9 (overweight), you should gain 15-25 lb.  Greater than 30 (obese), you should gain 11-20 lb.  What if I am having twins or multiples? Generally, pregnant women who will be having twins or multiples may need to increase their daily calories by 300-600 calories each day. The recommended range for total weight gain is 25-54 lb, depending on your pre-pregnancy BMI. Talk with your health care provider for specific guidance about additional nutritional needs, weight gain, and exercise during your pregnancy. What foods can I eat? Grains Any  grains. Try to choose whole grains, such as whole-wheat bread, oatmeal, or brown rice. Vegetables Any vegetables. Try to eat a variety of colors and types of vegetables to get a full range of vitamins and minerals. Remember to wash your vegetables well before eating. Fruits Any fruits. Try to eat a variety of colors and types of fruit to get a full range of vitamins and minerals. Remember to wash your fruits well before eating. Meats and Other Protein Sources Lean meats, including chicken, [place], fish, and lean cuts of beef, veal, or pork. Make sure that all meats are cooked to "well done." Tofu. Tempeh. Beans. Eggs. Peanut butter and other nut butters. Seafood, such as shrimp, crab, and lobster. If you choose fish, select types that are higher in omega-3 fatty acids, including salmon, herring, mussels, trout, sardines, and pollock. Make sure that all meats are cooked to food-safe temperatures. Dairy Pasteurized milk and milk alternatives. Pasteurized yogurt and pasteurized cheese. Cottage cheese. Sour cream. Beverages Water. Juices that contain 100% fruit juice or vegetable juice. Caffeine-free teas and decaffeinated coffee. Drinks that contain caffeine are okay to drink, but it is better to avoid caffeine. Keep your total caffeine  intake to less than 200 mg each day (12 oz of coffee, tea, or soda) or as directed by your health care provider. Condiments Any pasteurized condiments. Sweets and Desserts Any sweets and desserts. Fats and Oils Any fats and oils. The items listed above may not be a complete list of recommended foods or beverages. Contact your dietitian for more options. What foods are not recommended? Vegetables Unpasteurized (raw) vegetable juices. Fruits Unpasteurized (raw) fruit juices. Meats and Other Protein Sources Cured meats that have nitrates, such as bacon, salami, and hotdogs. Luncheon meats, bologna, or other deli meats (unless they are reheated until they are  steaming hot). Refrigerated pate, meat spreads from a meat counter, smoked seafood that is found in the refrigerated section of a store. Raw fish, such as sushi or sashimi. High mercury content fish, such as tilefish, shark, swordfish, and king mackerel. Raw meats, such as tuna or beef tartare. Undercooked meats and poultry. Make sure that all meats are cooked to food-safe temperatures. Dairy Unpasteurized (raw) milk and any foods that have raw milk in them. Soft cheeses, such as feta, queso blanco, queso fresco, Brie, Camembert cheeses, blue-veined cheeses, and Panela cheese (unless it is made with pasteurized milk, which must be stated on the label). Beverages Alcohol. Sugar-sweetened beverages, such as sodas, teas, or energy drinks. Condiments Homemade fermented foods and drinks, such as pickles, sauerkraut, or kombucha drinks. (Store-bought pasteurized versions of these are okay.) Other Salads that are made in the store, such as ham salad, chicken salad, egg salad, tuna salad, and seafood salad. The items listed above may not be a complete list of foods and beverages to avoid. Contact your dietitian for more information. This information is not intended to replace advice given to you by your health care provider. Make sure you discuss any questions you have with your health care provider. Document Released: 01/28/2014 Document Revised: 09/21/2015 Document Reviewed: 09/28/2013 Elsevier Interactive Patient Education  2018 [employer] Prenatal Care WHAT IS PRENATAL CARE? Prenatal care is the process of caring for a pregnant woman before she gives birth. Prenatal care makes sure that she and her baby remain as healthy as possible throughout pregnancy. Prenatal care may be provided by a midwife, family practice health care provider, or a childbirth and pregnancy specialist (obstetrician). Prenatal care may include physical examinations, testing, treatments, and education on nutrition, lifestyle, and  social support services. WHY IS PRENATAL CARE SO IMPORTANT? Early and consistent prenatal care increases the chance that you and your baby will remain healthy throughout your pregnancy. This type of care also decreases a baby's risk of being born too early (prematurely), or being born smaller than expected (small for gestational age). Any underlying medical conditions you may have that could pose a risk during your pregnancy are discussed during prenatal care visits. You will also be monitored regularly for any new conditions that may arise during your pregnancy so they can be treated quickly and effectively. WHAT HAPPENS DURING PRENATAL CARE VISITS? Prenatal care visits may include the following: Discussion Tell your health care provider about any new signs or symptoms you have experienced since your last visit. These might include:  Nausea or vomiting.  Increased or decreased level of energy.  Difficulty sleeping.  Back or leg pain.  Weight changes.  Frequent urination.  Shortness of breath with physical activity.  Changes in your skin, such as the development of a rash or itchiness.  Vaginal discharge or bleeding.  Feelings of excitement or nervousness.  Changes in   your baby's movements.  You may want to write down any questions or topics you want to discuss with your health care provider and bring them with you to your appointment. Examination During your first prenatal care visit, you will likely have a complete physical exam. Your health care provider will often examine your vagina, cervix, and the position of your uterus, as well as check your heart, lungs, and other body systems. As your pregnancy progresses, your health care provider will measure the size of your uterus and your baby's position inside your uterus. He or she may also examine you for early signs of labor. Your prenatal visits may also include checking your blood pressure and, after about 10-12 weeks of  pregnancy, listening to your baby's heartbeat. Testing Regular testing often includes:  Urinalysis. This checks your urine for glucose, protein, or signs of infection.  Blood count. This checks the levels of white and red blood cells in your body.  Tests for sexually transmitted infections (STIs). Testing for STIs at the beginning of pregnancy is routinely done and is required in many states.  Antibody testing. You will be checked to see if you are immune to certain illnesses, such as rubella, that can affect a developing fetus.  Glucose screen. Around 24-28 weeks of pregnancy, your blood glucose level will be checked for signs of gestational diabetes. Follow-up tests may be recommended.  Group B strep. This is a bacteria that is commonly found inside a woman's vagina. This test will inform your health care provider if you need an antibiotic to reduce the amount of this bacteria in your body prior to labor and childbirth.  Ultrasound. Many pregnant women undergo an ultrasound screening around 18-20 weeks of pregnancy to evaluate the health of the fetus and check for any developmental abnormalities.  HIV (human immunodeficiency virus) testing. Early in your pregnancy, you will be screened for HIV. If you are at high risk for HIV, this test may be repeated during your third trimester of pregnancy.  You may be offered other testing based on your age, personal or family medical history, or other factors. HOW OFTEN SHOULD I PLAN TO SEE MY HEALTH CARE PROVIDER FOR PRENATAL CARE? Your prenatal care check-up schedule depends on any medical conditions you have before, or develop during, your pregnancy. If you do not have any underlying medical conditions, you will likely be seen for checkups:  Monthly, during the first 6 months of pregnancy.  Twice a month during months 7 and 8 of pregnancy.  Weekly starting in the 9th month of pregnancy and until delivery.  If you develop signs of early labor  or other concerning signs or symptoms, you may need to see your health care provider more often. Ask your health care provider what prenatal care schedule is best for you. WHAT CAN I DO TO KEEP MYSELF AND MY BABY AS HEALTHY AS POSSIBLE DURING MY PREGNANCY?  Take a prenatal vitamin containing 400 micrograms (0.4 mg) of folic acid every day. Your health care provider may also ask you to take additional vitamins such as iodine, vitamin D, iron, copper, and zinc.  Take 1500-2000 mg of calcium daily starting at your 20th week of pregnancy until you deliver your baby.  Make sure you are up to date on your vaccinations. Unless directed otherwise by your health care provider: ? You should receive a tetanus, diphtheria, and pertussis (Tdap) vaccination between the 27th and 36th week of your pregnancy, regardless of when your last Tdap immunization   occurred. This helps protect your baby from whooping cough (pertussis) after he or she is born. ? You should receive an annual inactivated influenza vaccine (IIV) to help protect you and your baby from influenza. This can be done at any point during your pregnancy.  Eat a well-rounded diet that includes: ? Fresh fruits and vegetables. ? Lean proteins. ? Calcium-rich foods such as milk, yogurt, hard cheeses, and dark, leafy greens. ? Whole grain breads.  Do noteat seafood high in mercury, including: ? Swordfish. ? Tilefish. ? Shark. ? King mackerel. ? More than 6 oz tuna per week.  Do not eat: ? Raw or undercooked meats or eggs. ? Unpasteurized foods, such as soft cheeses (brie, blue, or feta), juices, and milks. ? Lunch meats. ? Hot dogs that have not been heated until they are steaming.  Drink enough water to keep your urine clear or pale yellow. For many women, this may be 10 or more 8 oz glasses of water each day. Keeping yourself hydrated helps deliver nutrients to your baby and may prevent the start of pre-term uterine contractions.  Do not use  any tobacco products including cigarettes, chewing tobacco, or electronic cigarettes. If you need help quitting, ask your health care provider.  Do not drink beverages containing alcohol. No safe level of alcohol consumption during pregnancy has been determined.  Do not use any illegal drugs. These can harm your developing baby or cause a miscarriage.  Ask your health care provider or pharmacist before taking any prescription or over-the-counter medicines, herbs, or supplements.  Limit your caffeine intake to no more than 200 mg per day.  Exercise. Unless told otherwise by your health care provider, try to get 30 minutes of moderate exercise most days of the week. Do not  do high-impact activities, contact sports, or activities with a high risk of falling, such as horseback riding or downhill skiing.  Get plenty of rest.  Avoid anything that raises your body temperature, such as hot tubs and saunas.  If you own a cat, do not empty its litter box. Bacteria contained in cat feces can cause an infection called toxoplasmosis. This can result in serious harm to the fetus.  Stay away from chemicals such as insecticides, lead, mercury, and cleaning or paint products that contain solvents.  Do not have any X-rays taken unless medically necessary.  Take a childbirth and breastfeeding preparation class. Ask your health care provider if you need a referral or recommendation.  This information is not intended to replace advice given to you by your health care provider. Make sure you discuss any questions you have with your health care provider. Document Released: 04/18/2003 Document Revised: 09/18/2015 Document Reviewed: 06/30/2013 Elsevier Interactive Patient Education  2017 [employer].  Smoking During Pregnancy Smoking during pregnancy is unhealthy for you and your baby. Smoke from cigarettes, pipes, and cigars contains many chemicals that can cause cancer (carcinogens). Cigarettes also contain  a stimulant drug (nicotine). When you smoke, harmful substances that you breathe in enter your bloodstream and can be passed on to your baby. This can affect your baby's development. If you are planning to become pregnant or have recently become pregnant, talk with your health care provider about quitting smoking. How does smoking affect me? Smoking increases your risk for many long-term (chronic) diseases. These diseases include cancer, lung diseases, and heart disease. Smoking during pregnancy increases your risk of:  Losing the pregnancy (miscarriage or stillbirth).  Giving birth too early (premature birth).  Pregnancy  outside of the uterus (tubal pregnancy).  Having problems with the organ that provides the baby nourishment and oxygen (placenta), including: ? Attachment of the placenta over the opening of the uterus (placenta previa). ? Detachment of the placenta before the baby's birth (placental abruption).  Having your water break before labor begins (premature rupture of membranes).  How does smoking affect my baby? Before Birth Smoking during pregnancy:  Decreases blood flow and oxygen to your baby.  Increases your baby's risk of birth defects, such as heart defects.  Increases your baby's heart rate.  Slows your baby's growth in the uterus (intrauterine growth retardation).  After Birth Babies born to women who smoked during pregnancy may:  Have symptoms of nicotine withdrawal.  Need to stay in the hospital for special care.  May be too small at birth.  Have a high risk of: ? Serious health problems or lifelong disabilities. ? Sudden infant death syndrome (SIDS). ? Becoming obese. ? Developing behavior or learning problems.  What can happen if changes are not made? When babies are born with a birth defect or illness, they often need to stay in the hospital longer before going home. Hospital stays may also be longer if you had any complications during labor or  delivery. Longer hospital stays and more treatments result in higher costs for health care. Many health issues among babies born to mothers who smoke can have a lifelong impact. This may include the long-term need for certain medicines, therapies, or other treatments. What are the benefits of not smoking during pregnancy? You have a much better chance of having a healthy pregnancy and a healthy baby if you do not smoke while you are pregnant. Not smoking also means that you will have a better chance of living a long and healthy life, and your baby will have a better chance of growing into a healthy child and adult. What actions can be taken? Quitting smoking can be difficult. Ask your health care provider for help to stop smoking. You may also consider:  Counseling to help you quit smoking (smoking cessation counseling).  Psychotherapy.  Acupuncture.  Hypnosis.  Telephone [employer] hotlines.  If these methods do not help you, talk with your health care provider about other options. Do not take smoking cessation medicines or nicotine supplements unless your health care provider tells you to. Where to find more information: Learn more about smoking during pregnancy and quitting smoking from:  March of Dimes: www.marchofdimes.org/pregnancy/smoking-during-pregnancy.aspx  U.S. Department of Health and Human Services: women.smokefree.gov  American Cancer Society: www.cancer.org  American Heart Association: www.heart.org  National Cancer Institute: www.cancer.gov  For help to quit smoking:  National smoking cessation telephone hotline: 1-800-QUIT NOW 743-777-1615(951-723-4950)  Contact a health care provider if:  You are struggling to quit smoking.  You are a smoker and you become pregnant or plan to become pregnant.  You start smoking again after giving birth. Summary  Tobacco smoke contains harmful substances that can affect a baby's health and development.  Smoking increases the risk for serious  problems, such as miscarriage, birth defects, or premature birth.  If you need help to quit smoking, ask your health care provider. This information is not intended to replace advice given to you by your health care provider. Make sure you discuss any questions you have with your health care provider. Document Released: 08/27/2004 Document Revised: 02/02/2016 Document Reviewed: 02/02/2016 Elsevier Interactive Patient Education  2018 [employer]

## 2017-10-28 LAB — URINE DRUG PANEL 7
AMPHETAMINES, URINE: NEGATIVE ng/mL
BENZODIAZEPINE QUANT UR: NEGATIVE ng/mL
Barbiturate Quant, Ur: NEGATIVE ng/mL
Cannabinoid Quant, Ur: POSITIVE — AB
Cocaine (Metab.): NEGATIVE ng/mL
Opiate Quant, Ur: NEGATIVE ng/mL
PCP Quant, Ur: NEGATIVE ng/mL

## 2017-10-28 LAB — CHLAMYDIA/GONOCOCCUS/TRICHOMONAS, NAA
Chlamydia by NAA: NEGATIVE
GONOCOCCUS BY NAA: NEGATIVE
TRICH VAG BY NAA: NEGATIVE

## 2017-10-28 LAB — URINE CULTURE

## 2017-10-29 ENCOUNTER — Telehealth: Payer: Self-pay

## 2017-10-29 NOTE — Telephone Encounter (Signed)
Pt called triage line, she wanted proof of her pregnancy, I left a message for her to come pick up the copy or I could fax it , if she called back with a fax number.

## 2017-10-31 ENCOUNTER — Telehealth: Payer: Self-pay

## 2017-10-31 ENCOUNTER — Other Ambulatory Visit: Payer: Self-pay | Admitting: Maternal Newborn

## 2017-10-31 DIAGNOSIS — R110 Nausea: Principal | ICD-10-CM

## 2017-10-31 DIAGNOSIS — O26899 Other specified pregnancy related conditions, unspecified trimester: Secondary | ICD-10-CM

## 2017-10-31 MED ORDER — DOXYLAMINE-PYRIDOXINE ER 20-20 MG PO TBCR: 1.0000 | EXTENDED_RELEASE_TABLET | Freq: Every day | ORAL | 3 refills | Status: DC

## 2017-10-31 NOTE — Telephone Encounter (Signed)
Sent Bonjesta Rx.

## 2017-10-31 NOTE — Telephone Encounter (Signed)
Patient needs prior authorization for the nausea medication.  Pharmacy faxed over the information.

## 2017-10-31 NOTE — Telephone Encounter (Signed)
Pt aware.

## 2017-10-31 NOTE — Telephone Encounter (Signed)
Pt would like one if the providers to send in some nausea medication to her pharmacy.

## 2017-11-05 ENCOUNTER — Ambulatory Visit (INDEPENDENT_AMBULATORY_CARE_PROVIDER_SITE_OTHER): Payer: Medicaid Other | Admitting: Maternal Newborn

## 2017-11-05 ENCOUNTER — Other Ambulatory Visit: Payer: Medicaid Other

## 2017-11-05 ENCOUNTER — Encounter: Payer: Self-pay | Admitting: Maternal Newborn

## 2017-11-05 ENCOUNTER — Ambulatory Visit (INDEPENDENT_AMBULATORY_CARE_PROVIDER_SITE_OTHER): Payer: Medicaid Other

## 2017-11-05 VITALS — BP 100/60 | Wt 166.0 lb

## 2017-11-05 DIAGNOSIS — O3680X0 Pregnancy with inconclusive fetal viability, not applicable or unspecified: Secondary | ICD-10-CM

## 2017-11-05 DIAGNOSIS — Z3A08 8 weeks gestation of pregnancy: Secondary | ICD-10-CM | POA: Diagnosis not present

## 2017-11-05 DIAGNOSIS — O0990 Supervision of high risk pregnancy, unspecified, unspecified trimester: Secondary | ICD-10-CM

## 2017-11-05 DIAGNOSIS — O99330 Smoking (tobacco) complicating pregnancy, unspecified trimester: Secondary | ICD-10-CM | POA: Insufficient documentation

## 2017-11-05 DIAGNOSIS — O99331 Smoking (tobacco) complicating pregnancy, first trimester: Secondary | ICD-10-CM

## 2017-11-05 DIAGNOSIS — Z6832 Body mass index (BMI) 32.0-32.9, adult: Secondary | ICD-10-CM

## 2017-11-05 DIAGNOSIS — Z113 Encounter for screening for infections with a predominantly sexual mode of transmission: Secondary | ICD-10-CM

## 2017-11-05 DIAGNOSIS — O99210 Obesity complicating pregnancy, unspecified trimester: Secondary | ICD-10-CM

## 2017-11-05 DIAGNOSIS — Z131 Encounter for screening for diabetes mellitus: Secondary | ICD-10-CM

## 2017-11-05 LAB — OB RESULTS CONSOLE VARICELLA ZOSTER ANTIBODY, IGG: Varicella: IMMUNE

## 2017-11-05 NOTE — Progress Notes (Signed)
    Routine Prenatal Care Visit  Subjective  Kimberly Haas is a 27 y.o. G2P0101 at 1867w2d being seen today for ongoing prenatal care.  She is currently monitored for the following issues for this high-risk pregnancy and has Chronic hypertension affecting pregnancy; Depression affecting pregnancy, antepartum; Supervision of high risk pregnancy, antepartum; and Tobacco use during pregnancy on their problem list.  ----------------------------------------------------------------------------------- Patient reports nausea.   Vag. Bleeding: None.  ----------------------------------------------------------------------------------- The following portions of the patient's history were reviewed and updated as appropriate: allergies, current medications, past family history, past medical history, past social history, past surgical history and problem list. Problem list updated.  Objective  Blood pressure 100/60, weight 166 lb (75.3 kg), last menstrual period 09/08/2017. Pregravid weight 180 lb (81.6 kg) Total Weight Gain -14 lb (-6.35 kg) Body mass index is 30.36 kg/m. Urinalysis: Urine Protein: Negative Urine Glucose: Negative  Fetal Status: Fetal Heart Rate (bpm): 169         General:  Alert, oriented and cooperative. Patient is in no acute distress.  Skin: Skin is warm and dry. No rash noted.   Cardiovascular: Normal heart rate noted  Respiratory: Normal respiratory effort, no problems with respiration noted  Abdomen: Soft, gravid, appropriate for gestational age.       Pelvic:  Cervical exam deferred        Extremities: Normal range of motion.     Mental Status: Normal mood and affect. Normal behavior. Normal judgment and thought content.     Assessment   27 y.o. G2P0101 at 6067w2d, EDD 06/15/2018 by Last Menstrual Period presenting for routine prenatal visit.  Plan   Pregnancy #2 Problems (from 10/23/17 to present)    Problem Noted Resolved   Tobacco use during pregnancy 11/05/2017 by  Oswaldo ConroySchmid, Zakhari Fogel Y, CNM No   Chronic hypertension affecting pregnancy 10/23/2017 by Tresea MallGledhill, Jane, CNM No   Depression affecting pregnancy, antepartum 10/23/2017 by Tresea MallGledhill, Jane, CNM No   Supervision of high risk pregnancy, antepartum 10/23/2017 by Tresea MallGledhill, Jane, CNM No   Overview Addendum 11/05/2017  9:44 AM by Oswaldo ConroySchmid, Joselyn Edling Y, CNM    Clinic [facility] Prenatal Labs  Dating L=8 Blood type:     Genetic Screen Declines Antibody:   Anatomic US  Rubella:   Varicella: @VZVIGG @  GTT Early:               Third trimester:  RPR:     Rhogam  HBsAg:     TDaP vaccine                       Flu Shot: HIV:     Baby Food                                GBS:   Contraception  Pap: 08/2017, ASCUS/ HPV Negative  CBB     CS/VBAC    Support Person Boyfriend [name]            Dating ultrasound today. Single IUP, size=dates and FHR 169 bpm. Early GTT and NOB labs done.  Has ceased cannabis use. Bonjesta samples given.  Gestational age appropriate obstetric precautions were reviewed.  Please refer to After Visit Summary for other counseling recommendations.   Return in about 1 month (around 12/03/2017) for ROB.  Marcelyn BruinsJacelyn Ubaldo Daywalt, CNM 11/05/2017  10:11 AM

## 2017-11-05 NOTE — Patient Instructions (Signed)
First Trimester of Pregnancy The first trimester of pregnancy is from week 1 until the end of week 13 (months 1 through 3). A week after a sperm fertilizes an egg, the egg will implant on the wall of the uterus. This embryo will begin to develop into a baby. Genes from you and your partner will form the baby. The female genes will determine whether the baby will be a boy or a girl. At 6-8 weeks, the eyes and face will be formed, and the heartbeat can be seen on ultrasound. At the end of 12 weeks, all the baby's organs will be formed. Now that you are pregnant, you will want to do everything you can to have a healthy baby. Two of the most important things are to get good prenatal care and to follow your health care provider's instructions. Prenatal care is all the medical care you receive before the baby's birth. This care will help prevent, find, and treat any problems during the pregnancy and childbirth. Body changes during your first trimester Your body goes through many changes during pregnancy. The changes vary from woman to woman.  You may gain or lose a couple of pounds at first.  You may feel sick to your stomach (nauseous) and you may throw up (vomit). If the vomiting is uncontrollable, call your health care provider.  You may tire easily.  You may develop headaches that can be relieved by medicines. All medicines should be approved by your health care provider.  You may urinate more often. Painful urination may mean you have a bladder infection.  You may develop heartburn as a result of your pregnancy.  You may develop constipation because certain hormones are causing the muscles that push stool through your intestines to slow down.  You may develop hemorrhoids or swollen veins (varicose veins).  Your breasts may begin to grow larger and become tender. Your nipples may stick out more, and the tissue that surrounds them (areola) may become darker.  Your gums may bleed and may be  sensitive to brushing and flossing.  Dark spots or blotches (chloasma, mask of pregnancy) may develop on your face. This will likely fade after the baby is born.  Your menstrual periods will stop.  You may have a loss of appetite.  You may develop cravings for certain kinds of food.  You may have changes in your emotions from day to day, such as being excited to be pregnant or being concerned that something may go wrong with the pregnancy and baby.  You may have more vivid and strange dreams.  You may have changes in your hair. These can include thickening of your hair, rapid growth, and changes in texture. Some women also have hair loss during or after pregnancy, or hair that feels dry or thin. Your hair will most likely return to normal after your baby is born.  What to expect at prenatal visits During a routine prenatal visit:  You will be weighed to make sure you and the baby are growing normally.  Your blood pressure will be taken.  Your abdomen will be measured to track your baby's growth.  The fetal heartbeat will be listened to between weeks 10 and 14 of your pregnancy.  Test results from any previous visits will be discussed.  Your health care provider may ask you:  How you are feeling.  If you are feeling the baby move.  If you have had any abnormal symptoms, such as leaking fluid, bleeding, severe headaches,   or abdominal cramping.  If you are using any tobacco products, including cigarettes, chewing tobacco, and electronic cigarettes.  If you have any questions.  Other tests that may be performed during your first trimester include:  Blood tests to find your blood type and to check for the presence of any previous infections. The tests will also be used to check for low iron levels (anemia) and protein on red blood cells (Rh antibodies). Depending on your risk factors, or if you previously had diabetes during pregnancy, you may have tests to check for high blood  sugar that affects pregnant women (gestational diabetes).  Urine tests to check for infections, diabetes, or protein in the urine.  An ultrasound to confirm the proper growth and development of the baby.  Fetal screens for spinal cord problems (spina bifida) and Down syndrome.  HIV (human immunodeficiency virus) testing. Routine prenatal testing includes screening for HIV, unless you choose not to have this test.  You may need other tests to make sure you and the baby are doing well.  Follow these instructions at home: Medicines  Follow your health care provider's instructions regarding medicine use. Specific medicines may be either safe or unsafe to take during pregnancy.  Take a prenatal vitamin that contains at least 600 micrograms (mcg) of folic acid.  If you develop constipation, try taking a stool softener if your health care provider approves. Eating and drinking  Eat a balanced diet that includes fresh fruits and vegetables, whole grains, good sources of protein such as meat, eggs, or tofu, and low-fat dairy. Your health care provider will help you determine the amount of weight gain that is right for you.  Avoid raw meat and uncooked cheese. These carry germs that can cause birth defects in the baby.  Eating four or five small meals rather than three large meals a day may help relieve nausea and vomiting. If you start to feel nauseous, eating a few soda crackers can be helpful. Drinking liquids between meals, instead of during meals, also seems to help ease nausea and vomiting.  Limit foods that are high in fat and processed sugars, such as fried and sweet foods.  To prevent constipation: ? Eat foods that are high in fiber, such as fresh fruits and vegetables, whole grains, and beans. ? Drink enough fluid to keep your urine clear or pale yellow. Activity  Exercise only as directed by your health care provider. Most women can continue their usual exercise routine during  pregnancy. Try to exercise for 30 minutes at least 5 days a week. Exercising will help you: ? Control your weight. ? Stay in shape. ? Be prepared for labor and delivery.  Experiencing pain or cramping in the lower abdomen or lower back is a good sign that you should stop exercising. Check with your health care provider before continuing with normal exercises.  Try to avoid standing for long periods of time. Move your legs often if you must stand in one place for a long time.  Avoid heavy lifting.  Wear low-heeled shoes and practice good posture.  You may continue to have sex unless your health care provider tells you not to. Relieving pain and discomfort  Wear a good support bra to relieve breast tenderness.  Take warm sitz baths to soothe any pain or discomfort caused by hemorrhoids. Use hemorrhoid cream if your health care provider approves.  Rest with your legs elevated if you have leg cramps or low back pain.  If you develop   varicose veins in your legs, wear support hose. Elevate your feet for 15 minutes, 3-4 times a day. Limit salt in your diet. Prenatal care  Schedule your prenatal visits by the twelfth week of pregnancy. They are usually scheduled monthly at first, then more often in the last 2 months before delivery.  Write down your questions. Take them to your prenatal visits.  Keep all your prenatal visits as told by your health care provider. This is important. Safety  Wear your seat belt at all times when driving.  Make a list of emergency phone numbers, including numbers for family, friends, the hospital, and police and fire departments. General instructions  Ask your health care provider for a referral to a local prenatal education class. Begin classes no later than the beginning of month 6 of your pregnancy.  Ask for help if you have counseling or nutritional needs during pregnancy. Your health care provider can offer advice or refer you to specialists for help  with various needs.  Do not use hot tubs, steam rooms, or saunas.  Do not douche or use tampons or scented sanitary pads.  Do not cross your legs for long periods of time.  Avoid cat litter boxes and soil used by cats. These carry germs that can cause birth defects in the baby and possibly loss of the fetus by miscarriage or stillbirth.  Avoid all smoking, herbs, alcohol, and medicines not prescribed by your health care provider. Chemicals in these products affect the formation and growth of the baby.  Do not use any products that contain nicotine or tobacco, such as cigarettes and e-cigarettes. If you need help quitting, ask your health care provider. You may receive counseling support and other resources to help you quit.  Schedule a dentist appointment. At home, brush your teeth with a soft toothbrush and be gentle when you floss. Contact a health care provider if:  You have dizziness.  You have mild pelvic cramps, pelvic pressure, or nagging pain in the abdominal area.  You have persistent nausea, vomiting, or diarrhea.  You have a bad smelling vaginal discharge.  You have pain when you urinate.  You notice increased swelling in your face, hands, legs, or ankles.  You are exposed to fifth disease or chickenpox.  You are exposed to German measles (rubella) and have never had it. Get help right away if:  You have a fever.  You are leaking fluid from your vagina.  You have spotting or bleeding from your vagina.  You have severe abdominal cramping or pain.  You have rapid weight gain or loss.  You vomit blood or material that looks like coffee grounds.  You develop a severe headache.  You have shortness of breath.  You have any kind of trauma, such as from a fall or a car accident. Summary  The first trimester of pregnancy is from week 1 until the end of week 13 (months 1 through 3).  Your body goes through many changes during pregnancy. The changes vary from  woman to woman.  You will have routine prenatal visits. During those visits, your health care provider will examine you, discuss any test results you may have, and talk with you about how you are feeling. This information is not intended to replace advice given to you by your health care provider. Make sure you discuss any questions you have with your health care provider. Document Released: 04/09/2001 Document Revised: 03/27/2016 Document Reviewed: 03/27/2016 Elsevier Interactive Patient Education  2018 Elsevier   Inc.  

## 2017-11-05 NOTE — Progress Notes (Signed)
No concerns.rj 

## 2017-11-05 NOTE — Telephone Encounter (Signed)
Pa # [id number] call [facility] in 24 hours to check status

## 2017-11-06 ENCOUNTER — Other Ambulatory Visit: Payer: Self-pay | Admitting: Maternal Newborn

## 2017-11-06 DIAGNOSIS — O26899 Other specified pregnancy related conditions, unspecified trimester: Secondary | ICD-10-CM

## 2017-11-06 DIAGNOSIS — R110 Nausea: Principal | ICD-10-CM

## 2017-11-06 MED ORDER — DOXYLAMINE-PYRIDOXINE 10-10 MG PO TBEC: 2.0000 | DELAYED_RELEASE_TABLET | Freq: Every day | ORAL | 5 refills | Status: DC

## 2017-11-06 NOTE — Telephone Encounter (Signed)
Can you send in diclegis its covered by Medicaid. bonjesta not covered

## 2017-11-06 NOTE — Progress Notes (Signed)
Changed to Diclegis per pharmacy request.

## 2017-11-07 LAB — RPR+RH+ABO+RUB AB+AB SCR+CB...
ANTIBODY SCREEN: NEGATIVE
HEMOGLOBIN: 13.1 g/dL (ref 11.1–15.9)
HIV Screen 4th Generation wRfx: NONREACTIVE
Hematocrit: 38.6 % (ref 34.0–46.6)
Hepatitis B Surface Ag: NEGATIVE
MCH: 33.1 pg — ABNORMAL HIGH (ref 26.6–33.0)
MCHC: 33.9 g/dL (ref 31.5–35.7)
MCV: 98 fL — ABNORMAL HIGH (ref 79–97)
PLATELETS: 361 10*3/uL (ref 150–450)
RBC: 3.96 x10E6/uL (ref 3.77–5.28)
RDW: 13.2 % (ref 12.3–15.4)
RPR: NONREACTIVE
Rh Factor: POSITIVE
Varicella zoster IgG: 352 index (ref >165)
WBC: 13.1 10*3/uL — ABNORMAL HIGH (ref 3.4–10.8)

## 2017-11-07 LAB — GLUCOSE, 1 HOUR GESTATIONAL: GESTATIONAL DIABETES SCREEN: 123 mg/dL (ref 65–139)

## 2017-12-03 ENCOUNTER — Ambulatory Visit (INDEPENDENT_AMBULATORY_CARE_PROVIDER_SITE_OTHER): Payer: Medicaid Other | Admitting: Obstetrics and Gynecology

## 2017-12-03 VITALS — BP 120/60 | Wt 166.0 lb

## 2017-12-03 DIAGNOSIS — Z8751 Personal history of pre-term labor: Secondary | ICD-10-CM | POA: Insufficient documentation

## 2017-12-03 DIAGNOSIS — Z31438 Encounter for other genetic testing of female for procreative management: Secondary | ICD-10-CM

## 2017-12-03 DIAGNOSIS — Z3A12 12 weeks gestation of pregnancy: Secondary | ICD-10-CM

## 2017-12-03 DIAGNOSIS — O0990 Supervision of high risk pregnancy, unspecified, unspecified trimester: Secondary | ICD-10-CM

## 2017-12-03 DIAGNOSIS — Z1379 Encounter for other screening for genetic and chromosomal anomalies: Secondary | ICD-10-CM

## 2017-12-03 DIAGNOSIS — O10919 Unspecified pre-existing hypertension complicating pregnancy, unspecified trimester: Secondary | ICD-10-CM

## 2017-12-03 MED ORDER — HYDROXYPROGESTERONE CAPROATE 250 MG/ML IM OIL: 250.0000 mg | TOPICAL_OIL | Freq: Once | INTRAMUSCULAR | 3 refills | Status: DC

## 2017-12-03 NOTE — Progress Notes (Signed)
ROB

## 2017-12-03 NOTE — Progress Notes (Signed)
Routine Prenatal Care Visit  Subjective  Kimberly Haas is a 27 y.o. G2P0101 at [redacted]w[redacted]d being seen today for ongoing prenatal care.  She is currently monitored for the following issues for this high-risk pregnancy and has Chronic hypertension affecting pregnancy; Depression affecting pregnancy, antepartum; Supervision of high risk pregnancy, antepartum; Tobacco use during pregnancy; and History of preterm delivery on their problem list.  ----------------------------------------------------------------------------------- Patient reports no complaints.   Contractions: Not present. Vag. Bleeding: None.  Movement: Absent. Denies leaking of fluid.  ----------------------------------------------------------------------------------- The following portions of the patient's history were reviewed and updated as appropriate: allergies, current medications, past family history, past medical history, past social history, past surgical history and problem list. Problem list updated.   Objective  Last menstrual period 09/08/2017. Pregravid weight 180 lb (81.6 kg) Total Weight Gain -14 lb (-6.35 kg)  Body mass index is 30.36 kg/m.  Urinalysis: Urine Protein: Negative Urine Glucose: Negative  Fetal Status: Fetal Heart Rate (bpm): 155   Movement: Absent     General:  Alert, oriented and cooperative. Patient is in no acute distress.  Skin: Skin is warm and dry. No rash noted.   Cardiovascular: Normal heart rate noted  Respiratory: Normal respiratory effort, no problems with respiration noted  Abdomen: Soft, gravid, appropriate for gestational age. Pain/Pressure: Absent     Pelvic:  Cervical exam deferred        Extremities: Normal range of motion.     ental Status: Normal mood and affect. Normal behavior. Normal judgment and thought content.     Assessment   27 y.o. G2P0101 at [redacted]w[redacted]d by  06/15/2018, by Last Menstrual Period presenting for routine prenatal visit  Plan   Pregnancy #2 Problems (from  10/23/17 to present)    Problem Noted Resolved   History of preterm delivery 12/03/2017 by Vena Austria, MD No   Overview Signed 12/03/2017 10:18 AM by Vena Austria, MD    Makena candidate 35 week PPROM [ ]  Baseline cervical length 16 weeks      Tobacco use during pregnancy 11/05/2017 by Oswaldo Conroy, CNM No   Chronic hypertension affecting pregnancy 10/23/2017 by Tresea Mall, CNM No   Overview Addendum 12/03/2017 10:18 AM by Vena Austria, MD    [X]  Aspirin 81 mg daily after 12 weeks; discontinue after 36 weeks [ ]  baseline labs with CBC, CMP, urine protein/creatinine ratio [ ]  ultrasound for growth at 28, 32, 36 weeks   Current antihypertensives:  metoprolol 25mg    Baseline and surveillance labs (pulled in from [facility], refresh links as needed)  Lab Results  Component Value Date   PLT 361 11/05/2017   CREATININE 0.61 03/02/2012   AST 40 (H) 03/02/2012   ALT 54 (H) 03/02/2012    Antenatal Testing [facility] - O10.919  Group I  BP < 140/90, no preeclampsia, AGA,  nml AFV, +/- meds    Group II BP > 140/90, on meds, no preeclampsia, AGA, nml AFV  20-28-34-38  20-24-28-32-35-38  32//2 x wk  28//BPP wkly then 32//2 x wk  40 no meds; 39 meds  PRN or 37  Pre-eclampsia  GHTN - O13.9/Preeclampsia without severe features  - O14.00   Preeclampsia with severe features - O14.10  Q 3-4wks  Q 2 wks  28//BPP wkly then 32//2 x wk  Inpatient  37  PRN or 34         Depression affecting pregnancy, antepartum 10/23/2017 by Tresea Mall, CNM No   Supervision of high risk pregnancy, antepartum 10/23/2017 by  Tresea MallGledhill, Jane, CNM No   Overview Addendum 12/03/2017 10:16 AM by Vena AustriaStaebler, Megin Consalvo, MD    [facility] Prenatal Labs  Dating L=8 Blood type: O/Positive/-- (07/10 0948)   Genetic Screen NIPT: Inheritest: Antibody:Negative (07/10 0948)  Anatomic US  Rubella: <0.90 (07/10 0948) Varicella: Immune  GTT  RPR: Non Reactive (07/10 0948)   Rhogam N/A HBsAg: Negative  (07/10 0948)   TDaP vaccine                       Flu Shot: HIV: Non Reactive (07/10 0948)   Baby Food                                GBS:   Contraception  Pap: 09/11/2017, ASCUS/ HPV Negative  CBB     CS/VBAC    Support Person Boyfriend [name]              Gestational age appropriate obstetric precautions including but not limited to vaginal bleeding, contractions, leaking of fluid and fetal movement were reviewed in detail with the patient.    - Start low dose ASA at >[redacted] weeks gestation as per USPTF recommendation "Low-Dose Aspirin Use for the Prevention of Morbidity and Mortality From Preeclampsia: Preventive Medicine"  furthermore endorsed by ACOG, WHO, and NIH based on evidence level B for the prevention of preeclampsia  In women deemed high risk  (diabetes, renal disease, chronic hypertension, history of preeclampsia in prior gestation, autoimmune diseases, or multifetal gestations)  - Discussed genetic testing and accepts - [facility] need P/C ratio and CMP updated - Discussed Maken   Return in about 1 month (around 12/31/2017) for ROB, Maken injection, cervical length ultrasound.  Vena AustriaAndreas Kenly Xiao, MD, Evern CoreFACOG [facility], [facility] 12/03/2017, 10:32 AM

## 2017-12-03 NOTE — Patient Instructions (Addendum)
Start low dose ASA at >[redacted] weeks gestation as per USPTF recommendation "Low-Dose Aspirin Use for the Prevention of Morbidity and Mortality From Preeclampsia: Preventive Medicine"  furthermore endorsed by ACOG, WHO, and NIH based on evidence level B for the prevention of preeclampsia  In women deemed high risk  (diabetes, renal disease, chronic hypertension, history of preeclampsia in prior gestation, autoimmune diseases, or multifetal gestations).  ACOG Committee Opinion 743 "Low-Dose Asprin Use During Pregnancy" June 25th 2018  High Risk (Start if 1 or more present) History of preeclampsia Multifetal Gestation Chronic HTN Type I or II DM Renal Disease Autoimmune Disease (SLE, Antiphospholipid antibody syndrome)  Moderate Risk (consider starting if more than one present) Nulliparity Obesity (BMI >30) Family history of Preeclampsia (Mother or sister) Socioeconomic characteristics (African American, low socieeconomic status) Age 35 years or older Personal history factors (low birthweight of SGA, previous adverse pregnancy outcome, more than 10 year pregnancy interval)  

## 2017-12-08 LAB — MATERNIT 21 PLUS CORE, BLOOD
Chromosome 13: NEGATIVE
Chromosome 18: NEGATIVE
Chromosome 21: NEGATIVE
Y Chromosome: NOT DETECTED

## 2017-12-12 LAB — INHERITEST CORE(CF97,SMA,FRAX)

## 2017-12-31 ENCOUNTER — Ambulatory Visit (INDEPENDENT_AMBULATORY_CARE_PROVIDER_SITE_OTHER): Payer: Medicaid Other

## 2017-12-31 ENCOUNTER — Ambulatory Visit (INDEPENDENT_AMBULATORY_CARE_PROVIDER_SITE_OTHER): Payer: Medicaid Other | Admitting: Obstetrics and Gynecology

## 2017-12-31 VITALS — BP 110/84 | Wt 168.0 lb

## 2017-12-31 DIAGNOSIS — O0990 Supervision of high risk pregnancy, unspecified, unspecified trimester: Secondary | ICD-10-CM

## 2017-12-31 DIAGNOSIS — O09212 Supervision of pregnancy with history of pre-term labor, second trimester: Secondary | ICD-10-CM | POA: Diagnosis not present

## 2017-12-31 DIAGNOSIS — O09211 Supervision of pregnancy with history of pre-term labor, first trimester: Secondary | ICD-10-CM

## 2017-12-31 DIAGNOSIS — Z3A16 16 weeks gestation of pregnancy: Secondary | ICD-10-CM

## 2017-12-31 DIAGNOSIS — Z3A12 12 weeks gestation of pregnancy: Secondary | ICD-10-CM

## 2017-12-31 DIAGNOSIS — Z363 Encounter for antenatal screening for malformations: Secondary | ICD-10-CM

## 2017-12-31 DIAGNOSIS — Z8751 Personal history of pre-term labor: Secondary | ICD-10-CM

## 2017-12-31 DIAGNOSIS — O10913 Unspecified pre-existing hypertension complicating pregnancy, third trimester: Secondary | ICD-10-CM

## 2017-12-31 DIAGNOSIS — O10919 Unspecified pre-existing hypertension complicating pregnancy, unspecified trimester: Secondary | ICD-10-CM

## 2017-12-31 MED ORDER — HYDROXYPROGESTERONE CAPROATE 250 MG/ML IM OIL
250.0000 mg | TOPICAL_OIL | Freq: Once | INTRAMUSCULAR | Status: AC
  Administered 2017-12-31: 250 mg via INTRAMUSCULAR

## 2017-12-31 NOTE — Progress Notes (Signed)
Routine Prenatal Care Visit  Subjective  Kimberly Haas is a 27 y.o. G2P0101 at [redacted]w[redacted]d being seen today for ongoing prenatal care.  She is currently monitored for the following issues for this high-risk pregnancy and has Chronic hypertension affecting pregnancy; Depression affecting pregnancy, antepartum; Supervision of high risk pregnancy, antepartum; Tobacco use during pregnancy; and History of preterm delivery on their problem list.  ----------------------------------------------------------------------------------- Patient reports no complaints.   Contractions: Not present. Vag. Bleeding: None.  Movement: Absent. Denies leaking of fluid.  ----------------------------------------------------------------------------------- The following portions of the patient's history were reviewed and updated as appropriate: allergies, current medications, past family history, past medical history, past social history, past surgical history and problem list. Problem list updated.   Objective  Blood pressure 110/84, weight 168 lb (76.2 kg), last menstrual period 09/08/2017. Pregravid weight 180 lb (81.6 kg) Total Weight Gain -12 lb (-5.443 kg) Urinalysis:      Fetal Status: Fetal Heart Rate (bpm): 150   Movement: Absent     General:  Alert, oriented and cooperative. Patient is in no acute distress.  Skin: Skin is warm and dry. No rash noted.   Cardiovascular: Normal heart rate noted  Respiratory: Normal respiratory effort, no problems with respiration noted  Abdomen: Soft, gravid, appropriate for gestational age. Pain/Pressure: Absent     Pelvic:  Cervical exam deferred        Extremities: Normal range of motion.     ental Status: Normal mood and affect. Normal behavior. Normal judgment and thought content.   US Ob Transvaginal  Result Date: 12/31/2017 ULTRASOUND REPORT Location: [facility] Date of Service: 12/31/2017 Patient Name: Kimberly Haas DOB: [birth year] MRN: [id number]  Indications:Previous preterm delivery Findings: Mason Jim intrauterine pregnancy is visualized. FHR: 147 bpm Stomach:  visualized Kidneys: not visualized Bladder: visualized Transvaginal cervical length performed. Cervical length measures  3.82 cm in the shortest dimension. There is no change with fundal pressure. No funneling is present. Impression: 1. [redacted]w[redacted]d viable Singleton Intrauterine pregnancy by previously established criteria. 2. Cervical length is 3.82 cm. Recommendations: 1.Clinical correlation with the patient's History and Physical Exam. Mital bahen P Patel, RDMS Normal cervical length ultrasound. Visualized fetal anatomy appears within normal limits. Vena Austria, MD, [facility], [facility] 12/31/2017, 9:39 AM     Assessment   26 y.o. G2P0101 at [redacted]w[redacted]d by  06/15/2018, by Last Menstrual Period presenting for routine prenatal visit  Plan   Pregnancy #2 Problems (from 10/23/17 to present)    Problem Noted Resolved   History of preterm delivery 12/03/2017 by Vena Austria, MD No   Overview Signed 12/03/2017 10:18 AM by Vena Austria, MD    Makena candidate 35 week PPROM [ ]  Baseline cervical length 16 weeks      Tobacco use during pregnancy 11/05/2017 by Oswaldo Conroy, CNM No   Chronic hypertension affecting pregnancy 10/23/2017 by Tresea Mall, CNM No   Overview Addendum 12/03/2017 10:18 AM by Vena Austria, MD    [X]  Aspirin 81 mg daily after 12 weeks; discontinue after 36 weeks [ ]  baseline labs with CBC, CMP, urine protein/creatinine ratio [ ]  ultrasound for growth at 28, 32, 36 weeks   Current antihypertensives:  metoprolol 25mg    Baseline and surveillance labs (pulled in from [facility], refresh links as needed)  Lab Results  Component Value Date   PLT 361 11/05/2017   CREATININE 0.61 03/02/2012   AST 40 (H) 03/02/2012   ALT 54 (H) 03/02/2012    Antenatal Testing CHTN - O10.919  Group I  BP < 140/90, no preeclampsia, AGA,  nml AFV,  +/- meds    Group II BP > 140/90, on meds, no preeclampsia, AGA, nml AFV  20-28-34-38  20-24-28-32-35-38  32//2 x wk  28//BPP wkly then 32//2 x wk  40 no meds; 39 meds  PRN or 37  Pre-eclampsia  GHTN - O13.9/Preeclampsia without severe features  - O14.00   Preeclampsia with severe features - O14.10  Q 3-4wks  Q 2 wks  28//BPP wkly then 32//2 x wk  Inpatient  37  PRN or 34         Depression affecting pregnancy, antepartum 10/23/2017 by Tresea Mall, CNM No   Supervision of high risk pregnancy, antepartum 10/23/2017 by Tresea Mall, CNM No   Overview Addendum 12/17/2017  5:06 PM by Vena Austria, MD    Clinic [facility] Prenatal Labs  Dating L=8 Blood type: O/Positive/-- (07/10 0948)   Genetic Screen NIPT: Normal XX Inheritest: negative SMA, CF, and Fragile X Antibody:Negative (07/10 0948)  Anatomic [place]  Rubella: <0.90 (07/10 0948) Varicella: Immune  GTT  RPR: Non Reactive (07/10 0948)   Rhogam N/A HBsAg: Negative (07/10 0948)   TDaP vaccine                       Flu Shot: HIV: Non Reactive (07/10 0948)   Baby Food                                GBS:   Contraception  Pap: 09/11/2017, ASCUS/ HPV Negative  CBB     CS/VBAC    Support Person Boyfriend [name]               Gestational age appropriate obstetric precautions including but not limited to vaginal bleeding, contractions, leaking of fluid and fetal movement were reviewed in detail with the patient.    - normal cervical length today - baseline CMP and P/C ratio today  Return in about 4 weeks (around 01/28/2018) for ROB and anatomy scan, weekly Makena injection.  Vena Austria, MD, [facility], [facility] 12/31/2017, 9:55 AM

## 2017-12-31 NOTE — Progress Notes (Signed)
ROB Cervical length U/S today 17P

## 2018-01-01 LAB — COMPREHENSIVE METABOLIC PANEL
A/G RATIO: 1.4 (ref 1.2–2.2)
ALT: 22 IU/L (ref 0–32)
AST: 30 IU/L (ref 0–40)
Albumin: 3.6 g/dL (ref 3.5–5.5)
Alkaline Phosphatase: 60 IU/L (ref 39–117)
BUN/Creatinine Ratio: 7 — ABNORMAL LOW (ref 9–23)
BUN: 3 mg/dL — ABNORMAL LOW (ref 6–20)
CALCIUM: 8.8 mg/dL (ref 8.7–10.2)
CHLORIDE: 101 mmol/L (ref 96–106)
CO2: 21 mmol/L (ref 20–29)
Creatinine, Ser: 0.42 mg/dL — ABNORMAL LOW (ref 0.57–1.00)
GFR calc Af Amer: 164 mL/min/{1.73_m2} (ref >59)
GFR, EST NON AFRICAN AMERICAN: 142 mL/min/{1.73_m2} (ref >59)
GLOBULIN, TOTAL: 2.5 g/dL (ref 1.5–4.5)
Glucose: 73 mg/dL (ref 65–99)
POTASSIUM: 3.8 mmol/L (ref 3.5–5.2)
Sodium: 137 mmol/L (ref 134–144)
Total Protein: 6.1 g/dL (ref 6.0–8.5)

## 2018-01-01 LAB — PROTEIN / CREATININE RATIO, URINE
Creatinine, Urine: 145.3 mg/dL
PROTEIN UR: 16.4 mg/dL
Protein/Creat Ratio: 113 mg/g creat (ref 0–200)

## 2018-01-07 ENCOUNTER — Ambulatory Visit (INDEPENDENT_AMBULATORY_CARE_PROVIDER_SITE_OTHER): Payer: Medicaid Other

## 2018-01-07 DIAGNOSIS — Z3A17 17 weeks gestation of pregnancy: Secondary | ICD-10-CM | POA: Diagnosis not present

## 2018-01-07 DIAGNOSIS — Z8751 Personal history of pre-term labor: Secondary | ICD-10-CM

## 2018-01-07 DIAGNOSIS — O09212 Supervision of pregnancy with history of pre-term labor, second trimester: Secondary | ICD-10-CM | POA: Diagnosis not present

## 2018-01-07 MED ORDER — HYDROXYPROGESTERONE CAPROATE 250 MG/ML IM OIL
250.0000 mg | TOPICAL_OIL | Freq: Once | INTRAMUSCULAR | Status: AC
  Administered 2018-01-07: 250 mg via INTRAMUSCULAR

## 2018-01-14 ENCOUNTER — Ambulatory Visit (INDEPENDENT_AMBULATORY_CARE_PROVIDER_SITE_OTHER): Payer: Medicaid Other

## 2018-01-14 DIAGNOSIS — Z3A18 18 weeks gestation of pregnancy: Secondary | ICD-10-CM

## 2018-01-14 DIAGNOSIS — O09212 Supervision of pregnancy with history of pre-term labor, second trimester: Secondary | ICD-10-CM | POA: Diagnosis not present

## 2018-01-14 DIAGNOSIS — Z8751 Personal history of pre-term labor: Secondary | ICD-10-CM

## 2018-01-14 MED ORDER — HYDROXYPROGESTERONE CAPROATE 250 MG/ML IM OIL
250.0000 mg | TOPICAL_OIL | Freq: Once | INTRAMUSCULAR | Status: AC
  Administered 2018-01-14: 250 mg via INTRAMUSCULAR

## 2018-01-15 ENCOUNTER — Ambulatory Visit (INDEPENDENT_AMBULATORY_CARE_PROVIDER_SITE_OTHER): Payer: Medicaid Other | Admitting: Obstetrics and Gynecology

## 2018-01-15 VITALS — BP 118/70 | Wt 172.0 lb

## 2018-01-15 DIAGNOSIS — Z3A18 18 weeks gestation of pregnancy: Secondary | ICD-10-CM

## 2018-01-15 DIAGNOSIS — O9989 Other specified diseases and conditions complicating pregnancy, childbirth and the puerperium: Secondary | ICD-10-CM

## 2018-01-15 DIAGNOSIS — B3731 Acute candidiasis of vulva and vagina: Secondary | ICD-10-CM

## 2018-01-15 DIAGNOSIS — O09212 Supervision of pregnancy with history of pre-term labor, second trimester: Secondary | ICD-10-CM

## 2018-01-15 DIAGNOSIS — B373 Candidiasis of vulva and vagina: Secondary | ICD-10-CM

## 2018-01-15 DIAGNOSIS — R1030 Lower abdominal pain, unspecified: Secondary | ICD-10-CM

## 2018-01-15 DIAGNOSIS — O10919 Unspecified pre-existing hypertension complicating pregnancy, unspecified trimester: Secondary | ICD-10-CM

## 2018-01-15 DIAGNOSIS — Z8751 Personal history of pre-term labor: Secondary | ICD-10-CM

## 2018-01-15 DIAGNOSIS — O0990 Supervision of high risk pregnancy, unspecified, unspecified trimester: Secondary | ICD-10-CM

## 2018-01-15 DIAGNOSIS — O10912 Unspecified pre-existing hypertension complicating pregnancy, second trimester: Secondary | ICD-10-CM

## 2018-01-15 LAB — POCT WET PREP WITH KOH
Clue Cells Wet Prep HPF POC: NEGATIVE
KOH Prep POC: POSITIVE — AB
PH, VAGINAL: 4.5
Trichomonas, UA: NEGATIVE

## 2018-01-15 LAB — POCT URINALYSIS DIPSTICK
Bilirubin, UA: NEGATIVE
Blood, UA: NEGATIVE
Glucose, UA: NEGATIVE
Ketones, UA: NEGATIVE
LEUKOCYTES UA: NEGATIVE
Nitrite, UA: NEGATIVE
PROTEIN UA: NEGATIVE
Spec Grav, UA: 1.02 (ref 1.010–1.025)
Urobilinogen, UA: NEGATIVE E.U./dL — AB
pH, UA: 7 (ref 5.0–8.0)

## 2018-01-15 MED ORDER — CLOTRIMAZOLE 1 % EX CREA: 1.0000 "application " | TOPICAL_CREAM | Freq: Every day | CUTANEOUS | 0 refills | Status: AC

## 2018-01-15 NOTE — Progress Notes (Signed)
Routine Prenatal Care Visit  Subjective  Kimberly Haas is a 27 y.o. G2P0101 at 5762w3d being seen today for ongoing prenatal care.  She is currently monitored for the following issues for this high-risk pregnancy and has Chronic hypertension affecting pregnancy; Depression affecting pregnancy, antepartum; Supervision of high risk pregnancy, antepartum; Tobacco use during pregnancy; and History of preterm delivery on their problem list.  ----------------------------------------------------------------------------------- Patient reports acute-onset lower abdominal cramping that comes and goes. It is rated as moderate. It does not radiate. Nothing makes it better or worse. No associated symptoms. Denies fevers, chills, nausea, vomiting, diarrhea, constipation, vaginal symptoms of itching, burning, irritation or abnormal discharge.  Denies vaginal bleeding and pelvic pressure.    Contractions: Not present. Vag. Bleeding: None.  Movement: Present. Denies leaking of fluid.  ----------------------------------------------------------------------------------- The following portions of the patient's history were reviewed and updated as appropriate: allergies, current medications, past family history, past medical history, past social history, past surgical history and problem list. Problem list updated.   Objective  Blood pressure 118/70, weight 172 lb (78 kg), last menstrual period 09/08/2017. Pregravid weight 180 lb (81.6 kg) Total Weight Gain -8 lb (-3.629 kg) Urinalysis: Urine Protein    Urine Glucose    Fetal Status: Fetal Heart Rate (bpm): 150   Movement: Present     General:  Alert, oriented and cooperative. Patient is in no acute distress.  Skin: Skin is warm and dry. No rash noted.   Cardiovascular: Normal heart rate noted  Respiratory: Normal respiratory effort, no problems with respiration noted  Abdomen: Soft, gravid, appropriate for gestational age. Pain/Pressure: Present     Pelvic:   Cervical exam performed      Closed/thick/high  Extremities: Normal range of motion.  Edema: None  Mental Status: Normal mood and affect. Normal behavior. Normal judgment and thought content.   Wet Prep: PH: 4.5 Clue Cells: Negative Fungal elements: Positive Trichomonas: Negative   Assessment   26 y.o. G2P0101 at 3162w3d by  06/15/2018, by Last Menstrual Period presenting for work-in prenatal visit.  Diagnosed with vulvovaginal candidiasis.   Plan   Pregnancy #2 Problems (from 10/23/17 to present)    Problem Noted Resolved   History of preterm delivery 12/03/2017 by Vena AustriaStaebler, Andreas, MD No   Overview Addendum 12/31/2017  9:55 AM by Vena AustriaStaebler, Andreas, MD    Makena candidate 35 week PPROM [X]  Baseline cervical length 16 weeks - 3.82cm      Tobacco use during pregnancy 11/05/2017 by Oswaldo ConroySchmid, Jacelyn Y, CNM No   Chronic hypertension affecting pregnancy 10/23/2017 by Tresea MallGledhill, Jane, CNM No   Overview Addendum 12/03/2017 10:18 AM by Vena AustriaStaebler, Andreas, MD    [X]  Aspirin 81 mg daily after 12 weeks; discontinue after 36 weeks [ ]  baseline labs with CBC, CMP, urine protein/creatinine ratio [ ]  ultrasound for growth at 28, 32, 36 weeks   Current antihypertensives:  metoprolol 25mg    Baseline and surveillance labs [facility], refresh links as needed)  Lab Results  Component Value Date   PLT 361 11/05/2017   CREATININE 0.61 03/02/2012   AST 40 (H) 03/02/2012   ALT 54 (H) 03/02/2012    Antenatal Testing CHTN - O10.919  Group I  BP < 140/90, no preeclampsia, AGA,  nml AFV, +/- meds    Group II BP > 140/90, on meds, no preeclampsia, AGA, nml AFV  20-28-34-38  20-24-28-32-35-38  32//2 x wk  28//BPP wkly then 32//2 x wk  40 no meds; 39 meds  PRN or 37  Pre-eclampsia  GHTN - O13.9/Preeclampsia without severe features  - O14.00   Preeclampsia with severe features - O14.10  Q 3-4wks  Q 2 wks  28//BPP wkly then 32//2 x wk  Inpatient  37  PRN or 34          Depression affecting pregnancy, antepartum 10/23/2017 by Tresea Mall, CNM No   Supervision of high risk pregnancy, antepartum 10/23/2017 by Tresea Mall, CNM No   Overview Addendum 12/17/2017  5:06 PM by Vena Austria, MD    Clinic [facility] Prenatal Labs  Dating L=8 Blood type: O/Positive/-- (07/10 0948)   Genetic Screen NIPT: Normal XX Inheritest: negative SMA, CF, and Fragile X Antibody:Negative (07/10 0948)  Anatomic [place]  Rubella: <0.90 (07/10 0948) Varicella: Immune  GTT  RPR: Non Reactive (07/10 0948)   Rhogam N/A HBsAg: Negative (07/10 0948)   TDaP vaccine                       Flu Shot: HIV: Non Reactive (07/10 0948)   Baby Food                                GBS:   Contraception  Pap: 09/11/2017, ASCUS/ HPV Negative  CBB     CS/VBAC    Support Person Boyfriend [name]               Preterm labor symptoms and general obstetric precautions including but not limited to vaginal bleeding, contractions, leaking of fluid and fetal movement were reviewed in detail with the patient. Please refer to After Visit Summary for other counseling recommendations.   -reassurance given for today. - rx for yeast infection - urine culture - precautions for worsening symptoms - continue with 17OHP injections  Return if symptoms worsen or fail to improve, for Keep previously schduled appts.  Thomasene Mohair, MD, [facility], [facility] 01/15/2018 2:25 PM

## 2018-01-17 LAB — URINE CULTURE: Organism ID, Bacteria: NO GROWTH

## 2018-01-21 ENCOUNTER — Ambulatory Visit (INDEPENDENT_AMBULATORY_CARE_PROVIDER_SITE_OTHER): Payer: Medicaid Other

## 2018-01-21 DIAGNOSIS — O09212 Supervision of pregnancy with history of pre-term labor, second trimester: Secondary | ICD-10-CM | POA: Diagnosis not present

## 2018-01-21 DIAGNOSIS — Z3A19 19 weeks gestation of pregnancy: Secondary | ICD-10-CM

## 2018-01-21 DIAGNOSIS — O09892 Supervision of other high risk pregnancies, second trimester: Secondary | ICD-10-CM

## 2018-01-21 MED ORDER — HYDROXYPROGESTERONE CAPROATE 250 MG/ML IM OIL
250.0000 mg | TOPICAL_OIL | Freq: Once | INTRAMUSCULAR | Status: AC
  Administered 2018-01-21: 250 mg via INTRAMUSCULAR

## 2018-01-21 NOTE — Progress Notes (Signed)
Pt here for hydroxyprogesterone 250mg /ml which was given IM right glut.  NDC# [phone].

## 2018-01-26 ENCOUNTER — Ambulatory Visit (INDEPENDENT_AMBULATORY_CARE_PROVIDER_SITE_OTHER): Payer: Medicaid Other | Admitting: Advanced Practice Midwife

## 2018-01-26 ENCOUNTER — Encounter: Payer: Self-pay | Admitting: Advanced Practice Midwife

## 2018-01-26 VITALS — BP 116/78 | Wt 173.0 lb

## 2018-01-26 DIAGNOSIS — Z3A20 20 weeks gestation of pregnancy: Secondary | ICD-10-CM

## 2018-01-26 DIAGNOSIS — O09212 Supervision of pregnancy with history of pre-term labor, second trimester: Secondary | ICD-10-CM

## 2018-01-26 DIAGNOSIS — O368120 Decreased fetal movements, second trimester, not applicable or unspecified: Secondary | ICD-10-CM

## 2018-01-26 LAB — POCT URINALYSIS DIPSTICK OB
GLUCOSE, UA: NEGATIVE
POC,PROTEIN,UA: NEGATIVE

## 2018-01-26 NOTE — Progress Notes (Signed)
Routine Prenatal Care Visit  Subjective  Kimberly Haas is a 27 y.o. G2P0101 at [redacted]w[redacted]d being seen today for ongoing prenatal care.  She is currently monitored for the following issues for this high-risk pregnancy and has Chronic hypertension affecting pregnancy; Depression affecting pregnancy, antepartum; Supervision of high risk pregnancy, antepartum; Tobacco use during pregnancy; and History of preterm delivery on their problem list.  ----------------------------------------------------------------------------------- Patient reports has not felt the baby move since Saturday night. She has been feeling fetal movement for the past 2-3 weeks. .   Contractions: Not present. Vag. Bleeding: None.  Movement: (!) Decreased. Denies leaking of fluid.  ----------------------------------------------------------------------------------- The following portions of the patient's history were reviewed and updated as appropriate: allergies, current medications, past family history, past medical history, past social history, past surgical history and problem list. Problem list updated.   Objective  Blood pressure 116/78, weight 173 lb (78.5 kg), last menstrual period 09/08/2017. Pregravid weight 180 lb (81.6 kg) Total Weight Gain -7 lb (-3.175 kg) Urinalysis: Urine Protein Negative  Urine Glucose Negative  Fetal Status: Fetal Heart Rate (bpm): 155   Movement: (!) Decreased     General:  Alert, oriented and cooperative. Patient is in no acute distress.  Skin: Skin is warm and dry. No rash noted.   Cardiovascular: Normal heart rate noted  Respiratory: Normal respiratory effort, no problems with respiration noted  Abdomen: Soft, gravid, appropriate for gestational age. Pain/Pressure: Absent     Pelvic:  Cervical exam deferred        Extremities: Normal range of motion.     Mental Status: Normal mood and affect. Normal behavior. Normal judgment and thought content.   Assessment   27 y.o. G2P0101 at [redacted]w[redacted]d by   06/15/2018, by Last Menstrual Period presenting for work-in prenatal visit  Plan   Pregnancy #2 Problems (from 10/23/17 to present)    Problem Noted Resolved   History of preterm delivery 12/03/2017 by Vena Austria, MD No   Overview Addendum 12/31/2017  9:55 AM by Vena Austria, MD    Makena candidate 35 week PPROM [X]  Baseline cervical length 16 weeks - 3.82cm      Tobacco use during pregnancy 11/05/2017 by Oswaldo Conroy, CNM No   Chronic hypertension affecting pregnancy 10/23/2017 by Tresea Mall, CNM No   Overview Addendum 12/03/2017 10:18 AM by Vena Austria, MD    [X]  Aspirin 81 mg daily after 12 weeks; discontinue after 36 weeks [ ]  baseline labs with CBC, CMP, urine protein/creatinine ratio [ ]  ultrasound for growth at 28, 32, 36 weeks   Current antihypertensives:  metoprolol 25mg    Baseline and surveillance labs (pulled in from [facility], refresh links as needed)  Lab Results  Component Value Date   PLT 361 11/05/2017   CREATININE 0.61 03/02/2012   AST 40 (H) 03/02/2012   ALT 54 (H) 03/02/2012    Antenatal Testing CHTN - O10.919  Group I  BP < 140/90, no preeclampsia, AGA,  nml AFV, +/- meds    Group II BP > 140/90, on meds, no preeclampsia, AGA, nml AFV  20-28-34-38  20-24-28-32-35-38  32//2 x wk  28//BPP wkly then 32//2 x wk  40 no meds; 39 meds  PRN or 37  Pre-eclampsia  GHTN - O13.9/Preeclampsia without severe features  - O14.00   Preeclampsia with severe features - O14.10  Q 3-4wks  Q 2 wks  28//BPP wkly then 32//2 x wk  Inpatient  37  PRN or 34  Depression affecting pregnancy, antepartum 10/23/2017 by Tresea Mall, CNM No   Supervision of high risk pregnancy, antepartum 10/23/2017 by Tresea Mall, CNM No   Overview Addendum 12/17/2017  5:06 PM by Vena Austria, MD    Clinic [facility] Prenatal Labs  Dating L=8 Blood type: O/Positive/-- (07/10 0948)   Genetic Screen NIPT: Normal XX Inheritest: negative SMA, CF, and  Fragile X Antibody:Negative (07/10 0948)  Anatomic [place]  Rubella: <0.90 (07/10 0948) Varicella: Immune  GTT  RPR: Non Reactive (07/10 0948)   Rhogam N/A HBsAg: Negative (07/10 0948)   TDaP vaccine                       Flu Shot: HIV: Non Reactive (07/10 0948)   Baby Food                                GBS:   Contraception  Pap: 09/11/2017, ASCUS/ HPV Negative  CBB     CS/VBAC    Support Person Boyfriend [name]               Preterm labor symptoms and general obstetric precautions including but not limited to vaginal bleeding, contractions, leaking of fluid and fetal movement were reviewed in detail with the patient.   Return for has follow up already scheduled.  Tresea Mall, CNM 01/26/2018 3:21 PM

## 2018-01-26 NOTE — Progress Notes (Signed)
ROB work-in DFM/ last time Saturday No cramping/bleeding

## 2018-01-28 ENCOUNTER — Ambulatory Visit (INDEPENDENT_AMBULATORY_CARE_PROVIDER_SITE_OTHER): Payer: Medicaid Other | Admitting: Maternal Newborn

## 2018-01-28 ENCOUNTER — Ambulatory Visit (INDEPENDENT_AMBULATORY_CARE_PROVIDER_SITE_OTHER): Payer: Medicaid Other

## 2018-01-28 ENCOUNTER — Encounter: Payer: Self-pay | Admitting: Maternal Newborn

## 2018-01-28 VITALS — BP 100/60 | Wt 170.0 lb

## 2018-01-28 DIAGNOSIS — Z23 Encounter for immunization: Secondary | ICD-10-CM | POA: Diagnosis not present

## 2018-01-28 DIAGNOSIS — Z3A20 20 weeks gestation of pregnancy: Secondary | ICD-10-CM

## 2018-01-28 DIAGNOSIS — Z8751 Personal history of pre-term labor: Secondary | ICD-10-CM

## 2018-01-28 DIAGNOSIS — O0990 Supervision of high risk pregnancy, unspecified, unspecified trimester: Secondary | ICD-10-CM

## 2018-01-28 DIAGNOSIS — O09212 Supervision of pregnancy with history of pre-term labor, second trimester: Secondary | ICD-10-CM

## 2018-01-28 DIAGNOSIS — Z363 Encounter for antenatal screening for malformations: Secondary | ICD-10-CM | POA: Diagnosis not present

## 2018-01-28 MED ORDER — HYDROXYPROGESTERONE CAPROATE 250 MG/ML IM OIL
250.0000 mg | TOPICAL_OIL | Freq: Once | INTRAMUSCULAR | Status: AC
  Administered 2018-01-28: 250 mg via INTRAMUSCULAR

## 2018-01-28 NOTE — Progress Notes (Signed)
Routine Prenatal Care Visit  Subjective  Kimberly Haas is a 27 y.o. G2P0101 at [redacted]w[redacted]d being seen today for ongoing prenatal care.  She is currently monitored for the following issues for this high-risk pregnancy and has Chronic hypertension affecting pregnancy; Depression affecting pregnancy, antepartum; Supervision of high risk pregnancy, antepartum; Tobacco use during pregnancy; and History of preterm delivery on their problem list.  ----------------------------------------------------------------------------------- Patient reports no complaints.   Contractions: Not present. Vag. Bleeding: None.  Movement: Present. No leaking of fluid.  ----------------------------------------------------------------------------------- The following portions of the patient's history were reviewed and updated as appropriate: allergies, current medications, past family history, past medical history, past social history, past surgical history and problem list. Problem list updated.  Objective  Blood pressure 100/60, weight 170 lb (77.1 kg), last menstrual period 09/08/2017. Pregravid weight 180 lb (81.6 kg) Total Weight Gain -10 lb (-4.536 kg) Body mass index is 31.09 kg/m.   Urinalysis: Unable to leave sample, voided before ultrasound  Fetal Status: Fetal Heart Rate (bpm): 160 Fundal Height: 21 cm Movement: Present  Presentation: Vertex  General:  Alert, oriented and cooperative. Patient is in no acute distress.  Skin: Skin is warm and dry. No rash noted.   Cardiovascular: Normal heart rate noted  Respiratory: Normal respiratory effort, no problems with respiration noted  Abdomen: Soft, gravid, appropriate for gestational age. Pain/Pressure: Absent     Pelvic:  Cervical exam deferred        Extremities: Normal range of motion.  Edema: None  Mental Status: Normal mood and affect. Normal behavior. Normal judgment and thought content.     Assessment   27 y.o. G2P0101 at [redacted]w[redacted]d, EDD 06/15/2018 by Last  Menstrual Period presenting for a routine prenatal visit.  Plan   Pregnancy #2 Problems (from 10/23/17 to present)    Problem Noted Resolved   History of preterm delivery 12/03/2017 by Vena Austria, MD No   Overview Addendum 12/31/2017  9:55 AM by Vena Austria, MD    Makena candidate 35 week PPROM [X]  Baseline cervical length 16 weeks - 3.82cm      Tobacco use during pregnancy 11/05/2017 by Oswaldo Conroy, CNM No   Chronic hypertension affecting pregnancy 10/23/2017 by Tresea Mall, CNM No   Overview Addendum 12/03/2017 10:18 AM by Vena Austria, MD    [X]  Aspirin 81 mg daily after 12 weeks; discontinue after 36 weeks [ ]  baseline labs with CBC, CMP, urine protein/creatinine ratio [ ]  ultrasound for growth at 28, 32, 36 weeks   Current antihypertensives:  metoprolol 25mg    Baseline and surveillance labs (pulled in from [facility], refresh links as needed)  Lab Results  Component Value Date   PLT 361 11/05/2017   CREATININE 0.61 03/02/2012   AST 40 (H) 03/02/2012   ALT 54 (H) 03/02/2012    Antenatal Testing CHTN - O10.919  Group I  BP < 140/90, no preeclampsia, AGA,  nml AFV, +/- meds    Group II BP > 140/90, on meds, no preeclampsia, AGA, nml AFV  20-28-34-38  20-24-28-32-35-38  32//2 x wk  28//BPP wkly then 32//2 x wk  40 no meds; 39 meds  PRN or 37  Pre-eclampsia  GHTN - O13.9/Preeclampsia without severe features  - O14.00   Preeclampsia with severe features - O14.10  Q 3-4wks  Q 2 wks  28//BPP wkly then 32//2 x wk  Inpatient  37  PRN or 34         Depression affecting pregnancy, antepartum 10/23/2017 by Tresea Mall,  CNM No   Supervision of high risk pregnancy, antepartum 10/23/2017 by Tresea Mall, CNM No   Overview Addendum 12/17/2017  5:06 PM by Vena Austria, MD    [facility]inic [facility] Prenatal Labs  Dating L=8 Blood type: O/Positive/-- (07/10 0948)   Genetic Screen NIPT: Normal XX Inheritest: negative SMA, CF, and Fragile X  Antibody:Negative (07/10 0948)  Anatomic [place]  Rubella: <0.90 (07/10 0948) Varicella: Immune  GTT  RPR: Non Reactive (07/10 0948)   Rhogam N/A HBsAg: Negative (07/10 0948)   TDaP vaccine                       Flu Shot: HIV: Non Reactive (07/10 0948)   Baby Food                                GBS:   Contraception  Pap: 09/11/2017, ASCUS/ HPV Negative  CBB     CS/VBAC    Support Person Boyfriend [name]            Anatomy scan today complete and normal.  Flu shot accepted today.  Continue weekly Makena injections.  Return in about 4 weeks (around 02/25/2018) for ROB.  Marcelyn Bruins, CNM 01/28/2018  9:27 AM

## 2018-02-04 ENCOUNTER — Ambulatory Visit (INDEPENDENT_AMBULATORY_CARE_PROVIDER_SITE_OTHER): Payer: Medicaid Other

## 2018-02-04 DIAGNOSIS — Z8751 Personal history of pre-term labor: Secondary | ICD-10-CM

## 2018-02-04 DIAGNOSIS — O09212 Supervision of pregnancy with history of pre-term labor, second trimester: Secondary | ICD-10-CM | POA: Diagnosis not present

## 2018-02-04 DIAGNOSIS — Z3A21 21 weeks gestation of pregnancy: Secondary | ICD-10-CM | POA: Diagnosis not present

## 2018-02-04 MED ORDER — HYDROXYPROGESTERONE CAPROATE 250 MG/ML IM OIL
250.0000 mg | TOPICAL_OIL | Freq: Once | INTRAMUSCULAR | Status: AC
  Administered 2018-02-04: 250 mg via INTRAMUSCULAR

## 2018-02-11 ENCOUNTER — Ambulatory Visit (INDEPENDENT_AMBULATORY_CARE_PROVIDER_SITE_OTHER): Payer: Medicaid Other

## 2018-02-11 DIAGNOSIS — O09212 Supervision of pregnancy with history of pre-term labor, second trimester: Secondary | ICD-10-CM

## 2018-02-11 DIAGNOSIS — Z8751 Personal history of pre-term labor: Secondary | ICD-10-CM

## 2018-02-11 DIAGNOSIS — Z3A22 22 weeks gestation of pregnancy: Secondary | ICD-10-CM | POA: Diagnosis not present

## 2018-02-11 MED ORDER — HYDROXYPROGESTERONE CAPROATE 250 MG/ML IM OIL
250.0000 mg | TOPICAL_OIL | Freq: Once | INTRAMUSCULAR | Status: AC
  Administered 2018-02-11: 250 mg via INTRAMUSCULAR

## 2018-02-18 ENCOUNTER — Ambulatory Visit (INDEPENDENT_AMBULATORY_CARE_PROVIDER_SITE_OTHER): Payer: Medicaid Other

## 2018-02-18 DIAGNOSIS — Z8751 Personal history of pre-term labor: Secondary | ICD-10-CM

## 2018-02-18 DIAGNOSIS — Z3A23 23 weeks gestation of pregnancy: Secondary | ICD-10-CM

## 2018-02-18 DIAGNOSIS — O09212 Supervision of pregnancy with history of pre-term labor, second trimester: Secondary | ICD-10-CM

## 2018-02-18 MED ORDER — HYDROXYPROGESTERONE CAPROATE 250 MG/ML IM OIL
250.0000 mg | TOPICAL_OIL | Freq: Once | INTRAMUSCULAR | Status: AC
  Administered 2018-02-18: 250 mg via INTRAMUSCULAR

## 2018-02-25 ENCOUNTER — Ambulatory Visit (INDEPENDENT_AMBULATORY_CARE_PROVIDER_SITE_OTHER): Payer: Medicaid Other | Admitting: Advanced Practice Midwife

## 2018-02-25 ENCOUNTER — Encounter: Payer: Self-pay | Admitting: Advanced Practice Midwife

## 2018-02-25 VITALS — BP 114/74 | Wt 178.0 lb

## 2018-02-25 DIAGNOSIS — Z131 Encounter for screening for diabetes mellitus: Secondary | ICD-10-CM

## 2018-02-25 DIAGNOSIS — O0990 Supervision of high risk pregnancy, unspecified, unspecified trimester: Secondary | ICD-10-CM

## 2018-02-25 DIAGNOSIS — Z3A24 24 weeks gestation of pregnancy: Secondary | ICD-10-CM | POA: Diagnosis not present

## 2018-02-25 DIAGNOSIS — Z113 Encounter for screening for infections with a predominantly sexual mode of transmission: Secondary | ICD-10-CM

## 2018-02-25 DIAGNOSIS — O09212 Supervision of pregnancy with history of pre-term labor, second trimester: Secondary | ICD-10-CM | POA: Diagnosis not present

## 2018-02-25 DIAGNOSIS — Z130 Encounter for screening for diseases of the blood and blood-forming organs and certain disorders involving the immune mechanism: Secondary | ICD-10-CM

## 2018-02-25 MED ORDER — HYDROXYPROGESTERONE CAPROATE 250 MG/ML IM OIL
250.0000 mg | TOPICAL_OIL | Freq: Once | INTRAMUSCULAR | Status: AC
  Administered 2018-02-25: 250 mg via INTRAMUSCULAR

## 2018-02-25 NOTE — Progress Notes (Signed)
No vb. No lof. Has had swollen/itchy eye and taking allergyy medication

## 2018-02-25 NOTE — Progress Notes (Signed)
Routine Prenatal Care Visit  Subjective  Kimberly Haas is a 27 y.o. G2P0101 at [redacted]w[redacted]d being seen today for ongoing prenatal care.  She is currently monitored for the following issues for this high-risk pregnancy and has Chronic hypertension affecting pregnancy; Depression affecting pregnancy, antepartum; Supervision of high risk pregnancy, antepartum; Tobacco use during pregnancy; and History of preterm delivery on their problem list.  ----------------------------------------------------------------------------------- Patient reports allergic reaction on right eye lid is improving. She is taking OTC allergy med. No s/s of conjunctivitis.   Contractions: Not present. Vag. Bleeding: None.  Movement: Present. Denies leaking of fluid.  ----------------------------------------------------------------------------------- The following portions of the patient's history were reviewed and updated as appropriate: allergies, current medications, past family history, past medical history, past social history, past surgical history and problem list. Problem list updated.   Objective  Blood pressure 114/74, weight 178 lb (80.7 kg), last menstrual period 09/08/2017. Pregravid weight 180 lb (81.6 kg) Total Weight Gain -2 lb (-0.907 kg) Urinalysis: Urine Protein    Urine Glucose    Fetal Status: Fetal Heart Rate (bpm): 158 Fundal Height: 25 cm Movement: Present     General:  Alert, oriented and cooperative. Patient is in no acute distress.  Skin: Skin is warm and dry. No rash noted.   Cardiovascular: Normal heart rate noted  Respiratory: Normal respiratory effort, no problems with respiration noted  Abdomen: Soft, gravid, appropriate for gestational age. Pain/Pressure: Absent     Pelvic:  Cervical exam deferred        Extremities: Normal range of motion.  Edema: None  Mental Status: Normal mood and affect. Normal behavior. Normal judgment and thought content.   Assessment   27 y.o. G2P0101 at [redacted]w[redacted]d by   06/15/2018, by Last Menstrual Period presenting for routine prenatal visit  Plan   Pregnancy #2 Problems (from 10/23/17 to present)    Problem Noted Resolved   History of preterm delivery 12/03/2017 by Vena Austria, MD No   Overview Addendum 12/31/2017  9:55 AM by Vena Austria, MD    Makena candidate 35 week PPROM [X]  Baseline cervical length 16 weeks - 3.82cm      Tobacco use during pregnancy 11/05/2017 by Oswaldo Conroy, CNM No   Chronic hypertension affecting pregnancy 10/23/2017 by Tresea Mall, CNM No   Overview Addendum 12/03/2017 10:18 AM by Vena Austria, MD    [X]  Aspirin 81 mg daily after 12 weeks; discontinue after 36 weeks [ ]  baseline labs with CBC, CMP, urine protein/creatinine ratio [ ]  ultrasound for growth at 28, 32, 36 weeks   Current antihypertensives:  metoprolol 25mg    Baseline and surveillance labs (pulled in from [facility], refresh links as needed)  Lab Results  Component Value Date   PLT 361 11/05/2017   CREATININE 0.61 03/02/2012   AST 40 (H) 03/02/2012   ALT 54 (H) 03/02/2012    Antenatal Testing CHTN - O10.919  Group I  BP < 140/90, no preeclampsia, AGA,  nml AFV, +/- meds    Group II BP > 140/90, on meds, no preeclampsia, AGA, nml AFV  20-28-34-38  20-24-28-32-35-38  32//2 x wk  28//BPP wkly then 32//2 x wk  40 no meds; 39 meds  PRN or 37  Pre-eclampsia  GHTN - O13.9/Preeclampsia without severe features  - O14.00   Preeclampsia with severe features - O14.10  Q 3-4wks  Q 2 wks  28//BPP wkly then 32//2 x wk  Inpatient  37  PRN or 34         Depression  affecting pregnancy, antepartum 10/23/2017 by Tresea Mall, CNM No   Supervision of high risk pregnancy, antepartum 10/23/2017 by Tresea Mall, CNM No   Overview Addendum 01/28/2018  9:58 AM by Oswaldo Conroy, CNM    [facility]inic [facility] Prenatal Labs  Dating L=8 Blood type: O/Positive/-- (07/10 0948)   Genetic Screen NIPT: Normal XX Inheritest: negative SMA, CF, and  Fragile X Antibody:Negative (07/10 0948)  Anatomic [place] Normal 01/28/18 Rubella: <0.90 (07/10 0948) Varicella: Immune  GTT  RPR: Non Reactive (07/10 0948)   Rhogam N/A HBsAg: Negative (07/10 0948)   TDaP vaccine   Flu Shot: HIV: Non Reactive (07/10 0948)   Baby Food                                GBS:   Contraception  Pap: 09/11/2017, ASCUS/ HPV Negative  CBB     CS/VBAC    Support Person Boyfriend [name]               Preterm labor symptoms and general obstetric precautions including but not limited to vaginal bleeding, contractions, leaking of fluid and fetal movement were reviewed in detail with the patient.  Makena injection today   Return in about 4 weeks (around 03/25/2018) for 28 wk labs and rob.  Tresea Mall, CNM 02/25/2018 8:49 AM

## 2018-03-04 ENCOUNTER — Ambulatory Visit (INDEPENDENT_AMBULATORY_CARE_PROVIDER_SITE_OTHER): Payer: Medicaid Other

## 2018-03-04 DIAGNOSIS — Z8751 Personal history of pre-term labor: Secondary | ICD-10-CM

## 2018-03-04 DIAGNOSIS — O09212 Supervision of pregnancy with history of pre-term labor, second trimester: Secondary | ICD-10-CM

## 2018-03-04 DIAGNOSIS — Z3A25 25 weeks gestation of pregnancy: Secondary | ICD-10-CM

## 2018-03-04 MED ORDER — HYDROXYPROGESTERONE CAPROATE 250 MG/ML IM OIL
250.0000 mg | TOPICAL_OIL | Freq: Once | INTRAMUSCULAR | Status: AC
  Administered 2018-03-04: 250 mg via INTRAMUSCULAR

## 2018-03-11 ENCOUNTER — Ambulatory Visit (INDEPENDENT_AMBULATORY_CARE_PROVIDER_SITE_OTHER): Payer: Medicaid Other

## 2018-03-11 DIAGNOSIS — Z8751 Personal history of pre-term labor: Secondary | ICD-10-CM

## 2018-03-11 DIAGNOSIS — O09212 Supervision of pregnancy with history of pre-term labor, second trimester: Secondary | ICD-10-CM

## 2018-03-11 DIAGNOSIS — Z3A26 26 weeks gestation of pregnancy: Secondary | ICD-10-CM

## 2018-03-11 MED ORDER — HYDROXYPROGESTERONE CAPROATE 250 MG/ML IM OIL
250.0000 mg | TOPICAL_OIL | Freq: Once | INTRAMUSCULAR | Status: AC
  Administered 2018-03-11: 250 mg via INTRAMUSCULAR

## 2018-03-18 ENCOUNTER — Ambulatory Visit (INDEPENDENT_AMBULATORY_CARE_PROVIDER_SITE_OTHER): Payer: Medicaid Other

## 2018-03-18 DIAGNOSIS — Z3A27 27 weeks gestation of pregnancy: Secondary | ICD-10-CM | POA: Diagnosis not present

## 2018-03-18 DIAGNOSIS — O09212 Supervision of pregnancy with history of pre-term labor, second trimester: Secondary | ICD-10-CM | POA: Diagnosis not present

## 2018-03-18 DIAGNOSIS — Z8751 Personal history of pre-term labor: Secondary | ICD-10-CM

## 2018-03-18 MED ORDER — HYDROXYPROGESTERONE CAPROATE 250 MG/ML IM OIL
250.0000 mg | TOPICAL_OIL | Freq: Once | INTRAMUSCULAR | Status: AC
  Administered 2018-03-18: 250 mg via INTRAMUSCULAR

## 2018-03-25 ENCOUNTER — Ambulatory Visit (INDEPENDENT_AMBULATORY_CARE_PROVIDER_SITE_OTHER): Payer: Medicaid Other | Admitting: Advanced Practice Midwife

## 2018-03-25 ENCOUNTER — Other Ambulatory Visit: Payer: Medicaid Other

## 2018-03-25 ENCOUNTER — Encounter: Payer: Self-pay | Admitting: Advanced Practice Midwife

## 2018-03-25 VITALS — BP 118/68 | Wt 177.0 lb

## 2018-03-25 DIAGNOSIS — Z113 Encounter for screening for infections with a predominantly sexual mode of transmission: Secondary | ICD-10-CM

## 2018-03-25 DIAGNOSIS — Z131 Encounter for screening for diabetes mellitus: Secondary | ICD-10-CM

## 2018-03-25 DIAGNOSIS — O09213 Supervision of pregnancy with history of pre-term labor, third trimester: Secondary | ICD-10-CM | POA: Diagnosis not present

## 2018-03-25 DIAGNOSIS — Z130 Encounter for screening for diseases of the blood and blood-forming organs and certain disorders involving the immune mechanism: Secondary | ICD-10-CM

## 2018-03-25 DIAGNOSIS — Z8751 Personal history of pre-term labor: Secondary | ICD-10-CM

## 2018-03-25 DIAGNOSIS — Z3A28 28 weeks gestation of pregnancy: Secondary | ICD-10-CM | POA: Diagnosis not present

## 2018-03-25 DIAGNOSIS — O0990 Supervision of high risk pregnancy, unspecified, unspecified trimester: Secondary | ICD-10-CM

## 2018-03-25 LAB — POCT URINALYSIS DIPSTICK OB: Glucose, UA: NEGATIVE

## 2018-03-25 MED ORDER — HYDROXYPROGESTERONE CAPROATE 250 MG/ML IM OIL
250.0000 mg | TOPICAL_OIL | Freq: Once | INTRAMUSCULAR | Status: AC
  Administered 2018-03-25: 250 mg via INTRAMUSCULAR

## 2018-03-25 NOTE — Patient Instructions (Signed)
Third Trimester of Pregnancy The third trimester is from week 28 through week 40 (months 7 through 9). The third trimester is a time when the unborn baby (fetus) is growing rapidly. At the end of the ninth month, the fetus is about 20 inches in length and weighs 6-10 pounds. Body changes during your third trimester Your body will continue to go through many changes during pregnancy. The changes vary from woman to woman. During the third trimester:  Your weight will continue to increase. You can expect to gain 25-35 pounds (11-16 kg) by the end of the pregnancy.  You may begin to get stretch marks on your hips, abdomen, and breasts.  You may urinate more often because the fetus is moving lower into your pelvis and pressing on your bladder.  You may develop or continue to have heartburn. This is caused by increased hormones that slow down muscles in the digestive tract.  You may develop or continue to have constipation because increased hormones slow digestion and cause the muscles that push waste through your intestines to relax.  You may develop hemorrhoids. These are swollen veins (varicose veins) in the rectum that can itch or be painful.  You may develop swollen, bulging veins (varicose veins) in your legs.  You may have increased body aches in the pelvis, back, or thighs. This is due to weight gain and increased hormones that are relaxing your joints.  You may have changes in your hair. These can include thickening of your hair, rapid growth, and changes in texture. Some women also have hair loss during or after pregnancy, or hair that feels dry or thin. Your hair will most likely return to normal after your baby is born.  Your breasts will continue to grow and they will continue to become tender. A yellow fluid (colostrum) may leak from your breasts. This is the first milk you are producing for your baby.  Your belly button may stick out.  You may notice more swelling in your hands,  face, or ankles.  You may have increased tingling or numbness in your hands, arms, and legs. The skin on your belly may also feel numb.  You may feel short of breath because of your expanding uterus.  You may have more problems sleeping. This can be caused by the size of your belly, increased need to urinate, and an increase in your body's metabolism.  You may notice the fetus "dropping," or moving lower in your abdomen (lightening).  You may have increased vaginal discharge.  You may notice your joints feel loose and you may have pain around your pelvic bone.  What to expect at prenatal visits You will have prenatal exams every 2 weeks until week 36. Then you will have weekly prenatal exams. During a routine prenatal visit:  You will be weighed to make sure you and the baby are growing normally.  Your blood pressure will be taken.  Your abdomen will be measured to track your baby's growth.  The fetal heartbeat will be listened to.  Any test results from the previous visit will be discussed.  You may have a cervical check near your due date to see if your cervix has softened or thinned (effaced).  You will be tested for Group B streptococcus. This happens between 35 and 37 weeks.  Your health care provider may ask you:  What your birth plan is.  How you are feeling.  If you are feeling the baby move.  If you have had   any abnormal symptoms, such as leaking fluid, bleeding, severe headaches, or abdominal cramping.  If you are using any tobacco products, including cigarettes, chewing tobacco, and electronic cigarettes.  If you have any questions.  Other tests or screenings that may be performed during your third trimester include:  Blood tests that check for low iron levels (anemia).  Fetal testing to check the health, activity level, and growth of the fetus. Testing is done if you have certain medical conditions or if there are problems during the  pregnancy.  Nonstress test (NST). This test checks the health of your baby to make sure there are no signs of problems, such as the baby not getting enough oxygen. During this test, a belt is placed around your belly. The baby is made to move, and its heart rate is monitored during movement.  What is false labor? False labor is a condition in which you feel small, irregular tightenings of the muscles in the womb (contractions) that usually go away with rest, changing position, or drinking water. These are called Braxton Hicks contractions. Contractions may last for hours, days, or even weeks before true labor sets in. If contractions come at regular intervals, become more frequent, increase in intensity, or become painful, you should see your health care provider. What are the signs of labor?  Abdominal cramps.  Regular contractions that start at 10 minutes apart and become stronger and more frequent with time.  Contractions that start on the top of the uterus and spread down to the lower abdomen and back.  Increased pelvic pressure and dull back pain.  A watery or bloody mucus discharge that comes from the vagina.  Leaking of amniotic fluid. This is also known as your "water breaking." It could be a slow trickle or a gush. Let your health care provider know if it has a color or strange odor. If you have any of these signs, call your health care provider right away, even if it is before your due date. Follow these instructions at home: Medicines  Follow your health care provider's instructions regarding medicine use. Specific medicines may be either safe or unsafe to take during pregnancy.  Take a prenatal vitamin that contains at least 600 micrograms (mcg) of folic acid.  If you develop constipation, try taking a stool softener if your health care provider approves. Eating and drinking  Eat a balanced diet that includes fresh fruits and vegetables, whole grains, good sources of protein  such as meat, eggs, or tofu, and low-fat dairy. Your health care provider will help you determine the amount of weight gain that is right for you.  Avoid raw meat and uncooked cheese. These carry germs that can cause birth defects in the baby.  If you have low calcium intake from food, talk to your health care provider about whether you should take a daily calcium supplement.  Eat four or five small meals rather than three large meals a day.  Limit foods that are high in fat and processed sugars, such as fried and sweet foods.  To prevent constipation: ? Drink enough fluid to keep your urine clear or pale yellow. ? Eat foods that are high in fiber, such as fresh fruits and vegetables, whole grains, and beans. Activity  Exercise only as directed by your health care provider. Most women can continue their usual exercise routine during pregnancy. Try to exercise for 30 minutes at least 5 days a week. Stop exercising if you experience uterine contractions.  Avoid heavy   lifting.  Do not exercise in extreme heat or humidity, or at high altitudes.  Wear low-heel, comfortable shoes.  Practice good posture.  You may continue to have sex unless your health care provider tells you otherwise. Relieving pain and discomfort  Take frequent breaks and rest with your legs elevated if you have leg cramps or low back pain.  Take warm sitz baths to soothe any pain or discomfort caused by hemorrhoids. Use hemorrhoid cream if your health care provider approves.  Wear a good support bra to prevent discomfort from breast tenderness.  If you develop varicose veins: ? Wear support pantyhose or compression stockings as told by your healthcare provider. ? Elevate your feet for 15 minutes, 3-4 times a day. Prenatal care  Write down your questions. Take them to your prenatal visits.  Keep all your prenatal visits as told by your health care provider. This is important. Safety  Wear your seat belt at  all times when driving.  Make a list of emergency phone numbers, including numbers for family, friends, the hospital, and police and fire departments. General instructions  Avoid cat litter boxes and soil used by cats. These carry germs that can cause birth defects in the baby. If you have a cat, ask someone to clean the litter box for you.  Do not travel far distances unless it is absolutely necessary and only with the approval of your health care provider.  Do not use hot tubs, steam rooms, or saunas.  Do not drink alcohol.  Do not use any products that contain nicotine or tobacco, such as cigarettes and e-cigarettes. If you need help quitting, ask your health care provider.  Do not use any medicinal herbs or unprescribed drugs. These chemicals affect the formation and growth of the baby.  Do not douche or use tampons or scented sanitary pads.  Do not cross your legs for long periods of time.  To prepare for the arrival of your baby: ? Take prenatal classes to understand, practice, and ask questions about labor and delivery. ? Make a trial run to the hospital. ? Visit the hospital and tour the maternity area. ? Arrange for maternity or paternity leave through employers. ? Arrange for family and friends to take care of pets while you are in the hospital. ? Purchase a rear-facing car seat and make sure you know how to install it in your car. ? Pack your hospital bag. ? Prepare the baby's nursery. Make sure to remove all pillows and stuffed animals from the baby's crib to prevent suffocation.  Visit your dentist if you have not gone during your pregnancy. Use a soft toothbrush to brush your teeth and be gentle when you floss. Contact a health care provider if:  You are unsure if you are in labor or if your water has broken.  You become dizzy.  You have mild pelvic cramps, pelvic pressure, or nagging pain in your abdominal area.  You have lower back pain.  You have persistent  nausea, vomiting, or diarrhea.  You have an unusual or bad smelling vaginal discharge.  You have pain when you urinate. Get help right away if:  Your water breaks before 37 weeks.  You have regular contractions less than 5 minutes apart before 37 weeks.  You have a fever.  You are leaking fluid from your vagina.  You have spotting or bleeding from your vagina.  You have severe abdominal pain or cramping.  You have rapid weight loss or weight gain.    You have shortness of breath with chest pain.  You notice sudden or extreme swelling of your face, hands, ankles, feet, or legs.  Your baby makes fewer than 10 movements in 2 hours.  You have severe headaches that do not go away when you take medicine.  You have vision changes. Summary  The third trimester is from week 28 through week 40, months 7 through 9. The third trimester is a time when the unborn baby (fetus) is growing rapidly.  During the third trimester, your discomfort may increase as you and your baby continue to gain weight. You may have abdominal, leg, and back pain, sleeping problems, and an increased need to urinate.  During the third trimester your breasts will keep growing and they will continue to become tender. A yellow fluid (colostrum) may leak from your breasts. This is the first milk you are producing for your baby.  False labor is a condition in which you feel small, irregular tightenings of the muscles in the womb (contractions) that eventually go away. These are called Braxton Hicks contractions. Contractions may last for hours, days, or even weeks before true labor sets in.  Signs of labor can include: abdominal cramps; regular contractions that start at 10 minutes apart and become stronger and more frequent with time; watery or bloody mucus discharge that comes from the vagina; increased pelvic pressure and dull back pain; and leaking of amniotic fluid. This information is not intended to replace advice  given to you by your health care provider. Make sure you discuss any questions you have with your health care provider. Document Released: 04/09/2001 Document Revised: 09/21/2015 Document Reviewed: 06/16/2012 Elsevier Interactive Patient Education  2017 [employer]  

## 2018-03-25 NOTE — Progress Notes (Signed)
Routine Prenatal Care Visit  Subjective  Kimberly Haas is a 27 y.o. G2P0101 at [redacted]w[redacted]d being seen today for ongoing prenatal care.  She is currently monitored for the following issues for this high-risk pregnancy and has Chronic hypertension affecting pregnancy; Depression affecting pregnancy, antepartum; Supervision of high risk pregnancy, antepartum; Tobacco use during pregnancy; and History of preterm delivery on their problem list.  ----------------------------------------------------------------------------------- Patient reports no complaints.   Contractions: Not present. Vag. Bleeding: None.  Movement: Present. Denies leaking of fluid.  ----------------------------------------------------------------------------------- The following portions of the patient's history were reviewed and updated as appropriate: allergies, current medications, past family history, past medical history, past social history, past surgical history and problem list. Problem list updated.   Objective  Blood pressure 118/68, weight 177 lb (80.3 kg), last menstrual period 09/08/2017. Pregravid weight 180 lb (81.6 kg) Total Weight Gain -3 lb (-1.361 kg) Urinalysis: Urine Protein Trace  Urine Glucose Negative  Fetal Status: Fetal Heart Rate (bpm): 145 Fundal Height: 31 cm Movement: Present     General:  Alert, oriented and cooperative. Patient is in no acute distress.  Skin: Skin is warm and dry. No rash noted.   Cardiovascular: Normal heart rate noted  Respiratory: Normal respiratory effort, no problems with respiration noted  Abdomen: Soft, gravid, appropriate for gestational age. Pain/Pressure: Absent     Pelvic:  Cervical exam deferred        Extremities: Normal range of motion.  Edema: None  Mental Status: Normal mood and affect. Normal behavior. Normal judgment and thought content.   Assessment   27 y.o. G2P0101 at [redacted]w[redacted]d by  06/15/2018, by Last Menstrual Period presenting for routine prenatal  visit  Plan   Pregnancy #2 Problems (from 10/23/17 to present)    Problem Noted Resolved   History of preterm delivery 12/03/2017 by Vena Austria, MD No   Overview Addendum 12/31/2017  9:55 AM by Vena Austria, MD    Makena candidate 35 week PPROM [X]  Baseline cervical length 16 weeks - 3.82cm      Tobacco use during pregnancy 11/05/2017 by Oswaldo Conroy, CNM No   Chronic hypertension affecting pregnancy 10/23/2017 by Tresea Mall, CNM No   Overview Addendum 12/03/2017 10:18 AM by Vena Austria, MD    [X]  Aspirin 81 mg daily after 12 weeks; discontinue after 36 weeks [ ]  baseline labs with CBC, CMP, urine protein/creatinine ratio [ ]  ultrasound for growth at 28, 32, 36 weeks   Current antihypertensives:  metoprolol 25mg    Baseline and surveillance labs (pulled in from [facility], refresh links as needed)  Lab Results  Component Value Date   PLT 361 11/05/2017   CREATININE 0.61 03/02/2012   AST 40 (H) 03/02/2012   ALT 54 (H) 03/02/2012    Antenatal Testing CHTN - O10.919  Group I  BP < 140/90, no preeclampsia, AGA,  nml AFV, +/- meds    Group II BP > 140/90, on meds, no preeclampsia, AGA, nml AFV  20-28-34-38  20-24-28-32-35-38  32//2 x wk  28//BPP wkly then 32//2 x wk  40 no meds; 39 meds  PRN or 37  Pre-eclampsia  GHTN - O13.9/Preeclampsia without severe features  - O14.00   Preeclampsia with severe features - O14.10  Q 3-4wks  Q 2 wks  28//BPP wkly then 32//2 x wk  Inpatient  37  PRN or 34         Depression affecting pregnancy, antepartum 10/23/2017 by Tresea Mall, CNM No   Supervision of high risk pregnancy, antepartum 10/23/2017  by Tresea MallGledhill, Zephaniah Lubrano, CNM No   Overview Addendum 01/28/2018  9:58 AM by Oswaldo ConroySchmid, Jacelyn Y, CNM    [facility] Prenatal Labs  Dating L=8 Blood type: O/Positive/-- (07/10 0948)   Genetic Screen NIPT: Normal XX Inheritest: negative SMA, CF, and Fragile X Antibody:Negative (07/10 0948)  Anatomic US Normal 01/28/18  Rubella: <0.90 (07/10 0948) Varicella: Immune  GTT  RPR: Non Reactive (07/10 0948)   Rhogam N/A HBsAg: Negative (07/10 0948)   TDaP vaccine                       Flu Shot: HIV: Non Reactive (07/10 0948)   Baby Food                                GBS:   Contraception  Pap: 09/11/2017, ASCUS/ HPV Negative  CBB     CS/VBAC    Support Person Boyfriend [name]               Preterm labor symptoms and general obstetric precautions including but not limited to vaginal bleeding, contractions, leaking of fluid and fetal movement were reviewed in detail with the patient. Please refer to After Visit Summary for other counseling recommendations.   Return in about 2 weeks (around 04/08/2018) for growth scan and rob and weekly 17P inj.Tresea Mall.  Jasier Calabretta, CNM 03/25/2018 9:00 AM

## 2018-03-25 NOTE — Progress Notes (Signed)
ROB/28 week labs No concerns   

## 2018-03-26 LAB — 28 WEEK RH+PANEL
Basophils Absolute: 0 10*3/uL (ref 0.0–0.2)
Basos: 0 %
EOS (ABSOLUTE): 0.1 10*3/uL (ref 0.0–0.4)
Eos: 1 %
GESTATIONAL DIABETES SCREEN: 146 mg/dL — AB (ref 65–139)
HIV Screen 4th Generation wRfx: NONREACTIVE
Hematocrit: 32.4 % — ABNORMAL LOW (ref 34.0–46.6)
Hemoglobin: 11.1 g/dL (ref 11.1–15.9)
IMMATURE GRANS (ABS): 0.2 10*3/uL — AB (ref 0.0–0.1)
IMMATURE GRANULOCYTES: 1 %
LYMPHS: 12 %
Lymphocytes Absolute: 1.6 10*3/uL (ref 0.7–3.1)
MCH: 32.9 pg (ref 26.6–33.0)
MCHC: 34.3 g/dL (ref 31.5–35.7)
MCV: 96 fL (ref 79–97)
MONOS ABS: 0.7 10*3/uL (ref 0.1–0.9)
Monocytes: 5 %
NEUTROS PCT: 81 %
Neutrophils Absolute: 10.9 10*3/uL — ABNORMAL HIGH (ref 1.4–7.0)
Platelets: 275 10*3/uL (ref 150–450)
RBC: 3.37 x10E6/uL — AB (ref 3.77–5.28)
RDW: 12.3 % (ref 12.3–15.4)
RPR: NONREACTIVE
WBC: 13.4 10*3/uL — ABNORMAL HIGH (ref 3.4–10.8)

## 2018-04-01 ENCOUNTER — Other Ambulatory Visit: Payer: Self-pay | Admitting: Advanced Practice Midwife

## 2018-04-01 ENCOUNTER — Ambulatory Visit (INDEPENDENT_AMBULATORY_CARE_PROVIDER_SITE_OTHER): Payer: Medicaid Other

## 2018-04-01 DIAGNOSIS — Z8751 Personal history of pre-term labor: Secondary | ICD-10-CM | POA: Diagnosis not present

## 2018-04-01 DIAGNOSIS — R7309 Other abnormal glucose: Secondary | ICD-10-CM

## 2018-04-01 MED ORDER — HYDROXYPROGESTERONE CAPROATE 250 MG/ML IM OIL
250.0000 mg | TOPICAL_OIL | Freq: Once | INTRAMUSCULAR | Status: AC
  Administered 2018-04-01: 250 mg via INTRAMUSCULAR

## 2018-04-01 NOTE — Progress Notes (Signed)
Order placed for 3 hour gtt- 1 hour elevated.

## 2018-04-08 ENCOUNTER — Other Ambulatory Visit: Payer: Medicaid Other

## 2018-04-08 ENCOUNTER — Ambulatory Visit (INDEPENDENT_AMBULATORY_CARE_PROVIDER_SITE_OTHER): Payer: Medicaid Other | Admitting: Obstetrics and Gynecology

## 2018-04-08 ENCOUNTER — Ambulatory Visit (INDEPENDENT_AMBULATORY_CARE_PROVIDER_SITE_OTHER): Payer: Medicaid Other

## 2018-04-08 ENCOUNTER — Encounter: Payer: Self-pay | Admitting: Obstetrics and Gynecology

## 2018-04-08 VITALS — BP 122/74 | Wt 186.0 lb

## 2018-04-08 DIAGNOSIS — Z23 Encounter for immunization: Secondary | ICD-10-CM

## 2018-04-08 DIAGNOSIS — R7309 Other abnormal glucose: Secondary | ICD-10-CM

## 2018-04-08 DIAGNOSIS — O10913 Unspecified pre-existing hypertension complicating pregnancy, third trimester: Secondary | ICD-10-CM

## 2018-04-08 DIAGNOSIS — O09213 Supervision of pregnancy with history of pre-term labor, third trimester: Secondary | ICD-10-CM

## 2018-04-08 DIAGNOSIS — O0990 Supervision of high risk pregnancy, unspecified, unspecified trimester: Secondary | ICD-10-CM

## 2018-04-08 DIAGNOSIS — O99340 Other mental disorders complicating pregnancy, unspecified trimester: Secondary | ICD-10-CM

## 2018-04-08 DIAGNOSIS — O99343 Other mental disorders complicating pregnancy, third trimester: Secondary | ICD-10-CM

## 2018-04-08 DIAGNOSIS — Z3A30 30 weeks gestation of pregnancy: Secondary | ICD-10-CM

## 2018-04-08 DIAGNOSIS — Z8751 Personal history of pre-term labor: Secondary | ICD-10-CM

## 2018-04-08 DIAGNOSIS — Z6832 Body mass index (BMI) 32.0-32.9, adult: Secondary | ICD-10-CM

## 2018-04-08 DIAGNOSIS — F329 Major depressive disorder, single episode, unspecified: Secondary | ICD-10-CM

## 2018-04-08 DIAGNOSIS — Z362 Encounter for other antenatal screening follow-up: Secondary | ICD-10-CM

## 2018-04-08 DIAGNOSIS — O99213 Obesity complicating pregnancy, third trimester: Secondary | ICD-10-CM

## 2018-04-08 DIAGNOSIS — O99210 Obesity complicating pregnancy, unspecified trimester: Secondary | ICD-10-CM | POA: Insufficient documentation

## 2018-04-08 DIAGNOSIS — O10919 Unspecified pre-existing hypertension complicating pregnancy, unspecified trimester: Secondary | ICD-10-CM

## 2018-04-08 LAB — POCT URINALYSIS DIPSTICK OB
Glucose, UA: NEGATIVE
POC,PROTEIN,UA: NEGATIVE

## 2018-04-08 MED ORDER — HYDROXYPROGESTERONE CAPROATE 250 MG/ML IM OIL
250.0000 mg | TOPICAL_OIL | Freq: Once | INTRAMUSCULAR | Status: AC
  Administered 2018-04-08: 250 mg via INTRAMUSCULAR

## 2018-04-08 NOTE — Progress Notes (Signed)
Routine Prenatal Care Visit  Subjective  Kimberly Haas is a 27 y.o. G2P0101 at [redacted]w[redacted]d being seen today for ongoing prenatal care.  She is currently monitored for the following issues for this high-risk pregnancy and has Chronic hypertension affecting pregnancy; Depression affecting pregnancy, antepartum; Supervision of high risk pregnancy, antepartum; Tobacco use during pregnancy; History of preterm delivery; Obesity affecting pregnancy; and BMI 32.0-32.9,adult on their problem list.  ----------------------------------------------------------------------------------- Patient reports no complaints.   Contractions: Not present. Vag. Bleeding: None.  Movement: Present. Denies leaking of fluid.  U/S for growth today: 65th %ile. AFI nml 17 OHP injection today.   3-hr  Gtt today ----------------------------------------------------------------------------------- The following portions of the patient's history were reviewed and updated as appropriate: allergies, current medications, past family history, past medical history, past social history, past surgical history and problem list. Problem list updated.   Objective  Blood pressure 122/74, weight 186 lb (84.4 kg), last menstrual period 09/08/2017. Pregravid weight 180 lb (81.6 kg) Total Weight Gain 6 lb (2.722 kg) Urinalysis: Urine Protein Negative  Urine Glucose Negative  Fetal Status: Fetal Heart Rate (bpm): 155   Movement: Present     General:  Alert, oriented and cooperative. Patient is in no acute distress.  Skin: Skin is warm and dry. No rash noted.   Cardiovascular: Normal heart rate noted  Respiratory: Normal respiratory effort, no problems with respiration noted  Abdomen: Soft, gravid, appropriate for gestational age. Pain/Pressure: Absent     Pelvic:  Cervical exam deferred        Extremities: Normal range of motion.  Edema: None  Mental Status: Normal mood and affect. Normal behavior. Normal judgment and thought content.    Imaging Results: US Ob Follow Up  Result Date: 04/08/2018 Patient Name: Kimberly Haas DOB: 1990-09-25 MRN: [id number] ULTRASOUND REPORT Location: [facility] Date of Service: 04/08/2018 Indications:growth/afi Findings: Mason Jim intrauterine pregnancy is visualized with FHR at 155 BPM. Biometrics give an (U/S) Gestational age of [redacted]w[redacted]d and an (U/S) EDD of 06/05/2018; this correlates with the clinically established Estimated Date of Delivery: 06/15/18. Fetal presentation is Cephalic. Placenta: posterior,fundal. Grade: 1 AFI: Subjectively normal. Growth percentile is 65.0. EFW: 1,745 grams (3 lbs 14 oz) Impression: 1. [redacted]w[redacted]d Viable Singleton Intrauterine pregnancy previously established criteria. 2. Growth is 65.0 %ile.  Darlina Guys, RDMS RVT The ultrasound images and findings were reviewed by me and I agree with the above report. Thomasene Mohair, MD, [facility], [facility] 04/08/2018 10:54 AM      Assessment   27 y.o. G2P0101 at [redacted]w[redacted]d by  06/15/2018, by Last Menstrual Period presenting for routine prenatal visit  Plan   Pregnancy #2 Problems (from 10/23/17 to present)    Problem Noted Resolved   Obesity affecting pregnancy 04/08/2018 by Conard Novak, MD No   BMI 32.0-32.9,adult 04/08/2018 by Conard Novak, MD No   History of preterm delivery 12/03/2017 by Vena Austria, MD No   Overview Addendum 12/31/2017  9:55 AM by Vena Austria, MD    Makena candidate 35 week PPROM [X]  Baseline cervical length 16 weeks - 3.82cm      Tobacco use during pregnancy 11/05/2017 by Oswaldo Conroy, CNM No   Chronic hypertension affecting pregnancy 10/23/2017 by Tresea Mall, CNM No   Overview Addendum 12/03/2017 10:18 AM by Vena Austria, MD    [X]  Aspirin 81 mg daily after 12 weeks; discontinue after 36 weeks [ ]  baseline labs with CBC, CMP, urine protein/creatinine ratio [ ]  ultrasound for growth at 28,  32, 36 weeks   Current antihypertensives:  metoprolol  25mg    Baseline and surveillance labs ([facility], refresh links as needed)  Lab Results  Component Value Date   PLT 361 11/05/2017   CREATININE 0.61 03/02/2012   AST 40 (H) 03/02/2012   ALT 54 (H) 03/02/2012    Antenatal Testing CHTN - O10.919  Group I  BP < 140/90, no preeclampsia, AGA,  nml AFV, +/- meds    Group II BP > 140/90, on meds, no preeclampsia, AGA, nml AFV  20-28-34-38  20-24-28-32-35-38  32//2 x wk  28//BPP wkly then 32//2 x wk  40 no meds; 39 meds  PRN or 37  Pre-eclampsia  GHTN - O13.9/Preeclampsia without severe features  - O14.00   Preeclampsia with severe features - O14.10  Q 3-4wks  Q 2 wks  28//BPP wkly then 32//2 x wk  Inpatient  37  PRN or 34         Depression affecting pregnancy, antepartum 10/23/2017 by Tresea MallGledhill, Jane, CNM No   Supervision of high risk pregnancy, antepartum 10/23/2017 by Tresea MallGledhill, Jane, CNM No   Overview Addendum 01/28/2018  9:58 AM by Oswaldo ConroySchmid, Jacelyn Y, CNM    Clinic [facility] Prenatal Labs  Dating L=8 Blood type: O/Positive/-- (07/10 0948)   Genetic Screen NIPT: Normal XX Inheritest: negative SMA, CF, and Fragile X Antibody:Negative (07/10 0948)  Anatomic US Normal 01/28/18 Rubella: <0.90 (07/10 0948) Varicella: Immune  GTT  RPR: Non Reactive (07/10 0948)   Rhogam N/A HBsAg: Negative (07/10 0948)   TDaP vaccine                       Flu Shot: HIV: Non Reactive (07/10 0948)   Baby Food                                GBS:   Contraception  Pap: 09/11/2017, ASCUS/ HPV Negative  CBB     CS/VBAC    Support Person Boyfriend [name]               Preterm labor symptoms and general obstetric precautions including but not limited to vaginal bleeding, contractions, leaking of fluid and fetal movement were reviewed in detail with the patient. Please refer to After Visit Summary for other counseling recommendations.   Return in about 2 weeks (around 04/22/2018).  Thomasene MohairStephen Madelaine Whipple, MD, [facility] FrederickFACOG [facility], [facility] 04/08/2018 10:59 AM

## 2018-04-08 NOTE — Addendum Note (Signed)
Addended by: Liliane ShiSHAW, BEVERLY M on: 04/08/2018 11:20 AM   Modules accepted: Orders

## 2018-04-09 LAB — GESTATIONAL GLUCOSE TOLERANCE
GLUCOSE 1 HOUR GTT: 153 mg/dL (ref 65–179)
GLUCOSE 3 HOUR GTT: 134 mg/dL (ref 65–139)
Glucose, Fasting: 79 mg/dL (ref 65–94)
Glucose, GTT - 2 Hour: 126 mg/dL (ref 65–154)

## 2018-04-09 LAB — TSH+FREE T4
Free T4: 0.98 ng/dL (ref 0.82–1.77)
TSH: 1.2 u[IU]/mL (ref 0.450–4.500)

## 2018-04-15 ENCOUNTER — Ambulatory Visit (INDEPENDENT_AMBULATORY_CARE_PROVIDER_SITE_OTHER): Payer: Medicaid Other

## 2018-04-15 DIAGNOSIS — Z8751 Personal history of pre-term labor: Secondary | ICD-10-CM

## 2018-04-15 DIAGNOSIS — O09213 Supervision of pregnancy with history of pre-term labor, third trimester: Secondary | ICD-10-CM

## 2018-04-15 MED ORDER — HYDROXYPROGESTERONE CAPROATE 250 MG/ML IM OIL
250.0000 mg | TOPICAL_OIL | Freq: Once | INTRAMUSCULAR | Status: AC
  Administered 2018-04-15: 250 mg via INTRAMUSCULAR

## 2018-04-23 ENCOUNTER — Encounter: Payer: Self-pay | Admitting: Certified Nurse Midwife

## 2018-04-23 ENCOUNTER — Observation Stay
Admission: EM | Admit: 2018-04-23 | Discharge: 2018-04-24 | Disposition: A | Discharge status: Home / Self Care | Payer: Medicaid Other | Attending: Certified Nurse Midwife | Admitting: Certified Nurse Midwife

## 2018-04-23 ENCOUNTER — Ambulatory Visit (INDEPENDENT_AMBULATORY_CARE_PROVIDER_SITE_OTHER): Payer: Medicaid Other | Admitting: Certified Nurse Midwife

## 2018-04-23 ENCOUNTER — Other Ambulatory Visit: Payer: Self-pay

## 2018-04-23 VITALS — BP 100/60 | Wt 189.0 lb

## 2018-04-23 DIAGNOSIS — O09213 Supervision of pregnancy with history of pre-term labor, third trimester: Secondary | ICD-10-CM

## 2018-04-23 DIAGNOSIS — R1030 Lower abdominal pain, unspecified: Secondary | ICD-10-CM | POA: Diagnosis present

## 2018-04-23 DIAGNOSIS — O26843 Uterine size-date discrepancy, third trimester: Secondary | ICD-10-CM

## 2018-04-23 DIAGNOSIS — O10913 Unspecified pre-existing hypertension complicating pregnancy, third trimester: Secondary | ICD-10-CM | POA: Diagnosis not present

## 2018-04-23 DIAGNOSIS — O10919 Unspecified pre-existing hypertension complicating pregnancy, unspecified trimester: Secondary | ICD-10-CM

## 2018-04-23 DIAGNOSIS — R109 Unspecified abdominal pain: Secondary | ICD-10-CM

## 2018-04-23 DIAGNOSIS — O4703 False labor before 37 completed weeks of gestation, third trimester: Secondary | ICD-10-CM

## 2018-04-23 DIAGNOSIS — O0990 Supervision of high risk pregnancy, unspecified, unspecified trimester: Secondary | ICD-10-CM

## 2018-04-23 DIAGNOSIS — O09899 Supervision of other high risk pregnancies, unspecified trimester: Secondary | ICD-10-CM

## 2018-04-23 DIAGNOSIS — O26893 Other specified pregnancy related conditions, third trimester: Secondary | ICD-10-CM | POA: Diagnosis present

## 2018-04-23 DIAGNOSIS — Z6832 Body mass index (BMI) 32.0-32.9, adult: Secondary | ICD-10-CM

## 2018-04-23 DIAGNOSIS — R8789 Other abnormal findings in specimens from female genital organs: Secondary | ICD-10-CM

## 2018-04-23 DIAGNOSIS — M545 Low back pain: Secondary | ICD-10-CM | POA: Diagnosis present

## 2018-04-23 DIAGNOSIS — Z8751 Personal history of pre-term labor: Secondary | ICD-10-CM

## 2018-04-23 DIAGNOSIS — O3433 Maternal care for cervical incompetence, third trimester: Secondary | ICD-10-CM

## 2018-04-23 DIAGNOSIS — Z3A32 32 weeks gestation of pregnancy: Secondary | ICD-10-CM | POA: Insufficient documentation

## 2018-04-23 DIAGNOSIS — O4700 False labor before 37 completed weeks of gestation, unspecified trimester: Secondary | ICD-10-CM

## 2018-04-23 DIAGNOSIS — O479 False labor, unspecified: Secondary | ICD-10-CM | POA: Diagnosis present

## 2018-04-23 DIAGNOSIS — O99340 Other mental disorders complicating pregnancy, unspecified trimester: Secondary | ICD-10-CM

## 2018-04-23 DIAGNOSIS — O99331 Smoking (tobacco) complicating pregnancy, first trimester: Secondary | ICD-10-CM

## 2018-04-23 DIAGNOSIS — F32A Depression, unspecified: Secondary | ICD-10-CM

## 2018-04-23 DIAGNOSIS — F329 Major depressive disorder, single episode, unspecified: Secondary | ICD-10-CM

## 2018-04-23 DIAGNOSIS — O99213 Obesity complicating pregnancy, third trimester: Secondary | ICD-10-CM

## 2018-04-23 LAB — POCT URINALYSIS DIPSTICK
Bilirubin, UA: NEGATIVE
Blood, UA: NEGATIVE
Glucose, UA: NEGATIVE
Ketones, UA: NEGATIVE
Nitrite, UA: NEGATIVE
Protein, UA: POSITIVE — AB
Spec Grav, UA: 1.02 (ref 1.010–1.025)
Urobilinogen, UA: NEGATIVE E.U./dL — AB
pH, UA: 6 (ref 5.0–8.0)

## 2018-04-23 LAB — URINALYSIS, COMPLETE (UACMP) WITH MICROSCOPIC
Bacteria, UA: NONE SEEN
Bilirubin Urine: NEGATIVE
Glucose, UA: NEGATIVE mg/dL
Ketones, ur: 20 mg/dL — AB
Leukocytes, UA: NEGATIVE
Nitrite: NEGATIVE
Protein, ur: NEGATIVE mg/dL
Specific Gravity, Urine: 1.011 (ref 1.005–1.030)
pH: 7 (ref 5.0–8.0)

## 2018-04-23 LAB — POCT URINALYSIS DIPSTICK OB: Glucose, UA: NEGATIVE

## 2018-04-23 LAB — FETAL FIBRONECTIN: Fetal Fibronectin: POSITIVE — AB

## 2018-04-23 MED ORDER — HYDROXYPROGESTERONE CAPROATE 250 MG/ML IM OIL
250.0000 mg | TOPICAL_OIL | Freq: Once | INTRAMUSCULAR | Status: AC
  Administered 2018-04-23: 250 mg via INTRAMUSCULAR

## 2018-04-23 MED ORDER — TERCONAZOLE 80 MG VA SUPP: 80.0000 mg | Freq: Every day | VAGINAL | 0 refills | Status: AC

## 2018-04-23 MED ORDER — BETAMETHASONE SOD PHOS & ACET 6 (3-3) MG/ML IJ SUSP
INTRAMUSCULAR | Status: AC
  Administered 2018-04-23: 12 mg via INTRAMUSCULAR
  Filled 2018-04-23: qty 5

## 2018-04-23 MED ORDER — NIFEDIPINE 10 MG PO CAPS
20.0000 mg | ORAL_CAPSULE | ORAL | Status: AC
  Administered 2018-04-23: 20 mg via ORAL
  Filled 2018-04-23: qty 2

## 2018-04-23 MED ORDER — BETAMETHASONE SOD PHOS & ACET 6 (3-3) MG/ML IJ SUSP
12.0000 mg | INTRAMUSCULAR | Status: AC
  Administered 2018-04-23 – 2018-04-24 (×2): 12 mg via INTRAMUSCULAR

## 2018-04-23 MED ORDER — NIFEDIPINE 10 MG PO CAPS
20.0000 mg | ORAL_CAPSULE | Freq: Three times a day (TID) | ORAL | Status: DC
  Administered 2018-04-24: 20 mg via ORAL
  Filled 2018-04-23 (×2): qty 2

## 2018-04-23 NOTE — Progress Notes (Signed)
C/o a lot of swelling in her feet even in mornings; a lot of pressure in lower stomach and pelvic area - sometimes she can't move b/c of it; constant back and lower abd pain.rj

## 2018-04-23 NOTE — Progress Notes (Signed)
Results for orders placed or performed in visit on 04/23/18 (from the past 24 hour(s))  POCT urinalysis dipstick     Status: Abnormal   Collection Time: 04/23/18 11:43 AM  Result Value Ref Range   Color, UA     Clarity, UA     Glucose, UA Negative Negative   Bilirubin, UA neg    Ketones, UA neg    Spec Grav, UA 1.020 1.010 - 1.025   Blood, UA neg    pH, UA 6.0 5.0 - 8.0   Protein, UA Positive (A) Negative   Urobilinogen, UA negative (A) 0.2 or 1.0 E.U./dL   Nitrite, UA neg    Leukocytes, UA Trace (A) Negative   Appearance     Odor    POC Urinalysis Dipstick OB     Status: Abnormal   Collection Time: 04/23/18 11:47 AM  Result Value Ref Range   Color, UA     Clarity, UA     Glucose, UA Negative Negative   Bilirubin, UA     Ketones, UA     Spec Grav, UA     Blood, UA     pH, UA     POC,PROTEIN,UA Trace Negative, Trace, Small (1+), Moderate (2+), Large (3+), 4+   Urobilinogen, UA     Nitrite, UA     Leukocytes, UA     Appearance     Odor    Urine culture sent  Farrel Connersolleen Kaeson Kleinert, CNM

## 2018-04-23 NOTE — Progress Notes (Signed)
HROB at 32wk3days: CHTN on metaprolol. Hx of depression (on Zoloft). Hx of PPROM and delivery at [redacted]wk gestation. Complains of increased bilateral lower abdominal pressure/pain and pain  in her lower back (sacral area) since Thanksgiving. Has worsened recently. Baby moving. Continues 17P injections weekly. Has had some leakage of clear fluid daily. Denies vaginal bleeding, vulvar itching or irritation.  FH 37cm/ FHT WNL. Palpated a couple of moderate contractions during exam Last EFW at 30 weeks was 3#14oz (65th%) One hour GTT was abnormal at 28 weeks, but 3 hour GTT WNL. SSE: External/ BUS: inflamed introitus Vagina: white creamy to mucoid discharge Negative pooling. Negative Nitrazine.  Wert prep positive for many hyphae, negative for clue cells or Trich Cervix: anterior/ 1/60%/-1/vertex/ medium AFI+3.50 +7.93+2.54+3.03=17.01 cm FFN done A: Preterm dilation with hx of PPROM at 36 weeks Monilial vulvovaginitis CHTN-normotensive on metaprolol  P:To L&D for monitoring for contractions/ cervical change Need to start 2x/week NST for CHTN Terazol 3 suppositories for monilia sent to pharmacy Farrel Connersolleen Nakaila Freeze, CNM

## 2018-04-23 NOTE — H&P (Signed)
Obstetric H&P   Chief Complaint: Preterm contractions  Prenatal Care Provider: [facility]  History of Present Illness: 27 y.o. G2P0101 at 4331w3d by  Last Menstrual Period presenting to L&D from clinic with intermittent lower abdominal pain and lower back pain that has been ongoing for two weeks but has gotten worse over the past couple of days. She is feeling pain about every 3-5 minutes and rates it as 4/10. She has had some vaginal discharge and was diagnosed with a yeast infection in the clinic today. She has a history of a preterm delivery at 7217w4d and has been receiving Makena during this pregnancy. She denies any vaginal bleeding. Cervical exam in office was 1/60/-1.   Pregravid weight 81.6 kg Total Weight Gain 4.082 kg  Pregnancy #2 Problems (from 10/23/17 to present)    Problem Noted Resolved   Obesity affecting pregnancy 04/08/2018 by Conard NovakJackson, Stephen D, MD No   BMI 32.0-32.9,adult 04/08/2018 by Conard NovakJackson, Stephen D, MD No   History of preterm delivery 12/03/2017 by Vena AustriaStaebler, Andreas, MD No   Overview Addendum 12/31/2017  9:55 AM by Vena AustriaStaebler, Andreas, MD    Makena candidate 35 week PPROM [X]  Baseline cervical length 16 weeks - 3.82cm      Tobacco use during pregnancy 11/05/2017 by Oswaldo ConroySchmid,  Y, CNM No   Chronic hypertension affecting pregnancy 10/23/2017 by Tresea MallGledhill, Jane, CNM No   Overview Addendum 12/03/2017 10:18 AM by Vena AustriaStaebler, Andreas, MD    [X]  Aspirin 81 mg daily after 12 weeks; discontinue after 36 weeks [ ]  baseline labs with CBC, CMP, urine protein/creatinine ratio [ ]  ultrasound for growth at 28, 32, 36 weeks   Current antihypertensives:  metoprolol 25mg    Baseline and surveillance labs [facility] refresh links as needed)  Lab Results  Component Value Date   PLT 361 11/05/2017   CREATININE 0.61 03/02/2012   AST 40 (H) 03/02/2012   ALT 54 (H) 03/02/2012    Antenatal Testing CHTN - O10.919  Group I  BP < 140/90, no preeclampsia, AGA,  nml AFV, +/- meds     Group II BP > 140/90, on meds, no preeclampsia, AGA, nml AFV  20-28-34-38  20-24-28-32-35-38  32//2 x wk  28//BPP wkly then 32//2 x wk  40 no meds; 39 meds  PRN or 37  Pre-eclampsia  GHTN - O13.9/Preeclampsia without severe features  - O14.00   Preeclampsia with severe features - O14.10  Q 3-4wks  Q 2 wks  28//BPP wkly then 32//2 x wk  Inpatient  37  PRN or 34         Depression affecting pregnancy, antepartum 10/23/2017 by Tresea MallGledhill, Jane, CNM No   Supervision of high risk pregnancy, antepartum 10/23/2017 by Tresea MallGledhill, Jane, CNM No   Overview Addendum 04/23/2018  1:38 PM by Farrel ConnersGutierrez, Colleen, CNM    Clinic [facility] Prenatal Labs  Dating L=8 Blood type: O/Positive/-- (07/10 0948)   Genetic Screen NIPT: Normal XX Inheritest: negative SMA, CF, and Fragile X Antibody:Negative (07/10 0948)  Anatomic US Normal 01/28/18 Rubella: <0.90 (07/10 0948) Varicella: Immune  GTT 146; 3 hr WNL: 79/103/126/134 RPR: Non Reactive (07/10 0948)   Rhogam N/A HBsAg: Negative (07/10 0948)   TDaP vaccine           04/08/18            Flu Shot:01/28/18 HIV: Non Reactive (07/10 0948)   Baby Food  GBS:   Contraception  Pap: 09/11/2017, ASCUS/ HPV Negative  CBB     CS/VBAC    Support Person Boyfriend [name]               Review of Systems: 10 point review of systems negative unless otherwise noted in HPI  Past Medical History: Past Medical History:  Diagnosis Date  . Acid reflux    occasional; TUMS as needed  . Depression    no current meds.  [place]. Gall stones   . Lactating mother    instructed to pump and discard breast milk x 24 hours post-op    Past Surgical History: Past Surgical History:  Procedure Laterality Date  . CHOLECYSTECTOMY  03/05/2012   Procedure: LAPAROSCOPIC CHOLECYSTECTOMY WITH INTRAOPERATIVE CHOLANGIOGRAM;  Surgeon: Currie Parishristian J Streck, MD;  Location: [facility];  Service: General;  Laterality: N/A;  laparoscopic  cholecystectomy with attempted interoperative cholangiogram  . NO PAST SURGERIES      Past Obstetric History: G2P0101   Family History: Negative and no history of birth defects.  Social History: Social History   Socioeconomic History  . Marital status: Single    Spouse name: Not on file  . Number of children: Not on file  . Years of education: Not on file  . Highest education level: Not on file  Occupational History  . Occupation: CSR    Employer: OTHER    Comment: [employer]  Social Needs  . Financial resource strain: Not on file  . Food insecurity:    Worry: Not on file    Inability: Not on file  . Transportation needs:    Medical: Not on file    Non-medical: Not on file  Tobacco Use  . Smoking status: Current Every Day Smoker    Packs/day: 0.15    Years: 8.00    Pack years: 1.20    Types: Cigarettes    Last attempt to quit: 07/05/2011    Years since quitting: 6.8  . Smokeless tobacco: Never Used  Substance and Sexual Activity  . Alcohol use: No    Comment: h/o EtOH use, but not now  . Drug use: No  . Sexual activity: Yes    Birth control/protection: None  Lifestyle  . Physical activity:    Days per week: Not on file    Minutes per session: Not on file  . Stress: Not on file  Relationships  . Social connections:    Talks on phone: Not on file    Gets together: Not on file    Attends religious service: Not on file    Active member of club or organization: Not on file    Attends meetings of clubs or organizations: Not on file    Relationship status: Not on file  . Intimate partner violence:    Fear of current or ex partner: Not on file    Emotionally abused: Not on file    Physically abused: Not on file    Forced sexual activity: Not on file  Other Topics Concern  . Not on file  Social History Narrative  . Not on file    Medications: Prior to Admission medications   Medication Sig Start Date End Date Taking? Authorizing Provider   Doxylamine-Pyridoxine (DICLEGIS) 10-10 MG TBEC Take 2 tablets by mouth at bedtime. If symptoms persist, add one tablet in the morning and one in the afternoon 11/06/17  Yes Oswaldo ConroySchmid,  Y, CNM  hydroxyprogesterone caproate (MAKENA) 250 mg/mL OIL injection Inject 1  mL (250 mg total) into the muscle once for 1 dose. 12/03/17 04/23/18 Yes Vena Austria, MD  metoprolol succinate (TOPROL-XL) 25 MG 24 hr tablet Take 25 mg by mouth daily. 10/05/17  Yes [provider]  sertraline (ZOLOFT) 50 MG tablet Take 1 tablet by mouth daily. 07/09/17 07/09/18 Yes [provider]  terconazole (TERAZOL 3) 80 MG vaginal suppository Place 1 suppository (80 mg total) vaginally at bedtime for 3 days. 04/23/18 04/26/18 Yes Farrel Conners, CNM    Allergies: No Known Allergies  Physical Exam: Vitals: Blood pressure 121/85, pulse 87, temperature 98.3 F (36.8 C), temperature source Oral, resp. rate 16, last menstrual period 09/08/2017.  FHT: Baseline 135, moderate variability, accelerations present, decelerations absent Toco: Uterine contractions: Frequency every 1-2 minutes; Duration 30-50 seconds; Intensity: mild  General: NAD HEENT: normocephalic, anicteric Pulmonary: No increased work of breathing Cardiovascular: RRR Abdomen: Gravid, mild discomfort with contractions Genitourinary: 1/50/-2 Extremities: no edema, erythema, or tenderness Neurologic: Grossly intact Psychiatric: mood appropriate, affect full  Labs: Results for orders placed or performed during the hospital encounter of 04/23/18 (from the past 24 hour(s))  Urinalysis, Complete w Microscopic     Status: Abnormal   Collection Time: 04/23/18  3:18 PM  Result Value Ref Range   Color, Urine YELLOW (A) YELLOW   APPearance CLEAR (A) CLEAR   Specific Gravity, Urine 1.011 1.005 - 1.030   pH 7.0 5.0 - 8.0   Glucose, UA NEGATIVE NEGATIVE mg/dL   Hgb urine dipstick MODERATE (A) NEGATIVE   Bilirubin Urine NEGATIVE NEGATIVE    Ketones, ur 20 (A) NEGATIVE mg/dL   Protein, ur NEGATIVE NEGATIVE mg/dL   Nitrite NEGATIVE NEGATIVE   Leukocytes, UA NEGATIVE NEGATIVE   RBC / HPF 0-5 0 - 5 RBC/hpf   WBC, UA 0-5 0 - 5 WBC/hpf   Bacteria, UA NONE SEEN NONE SEEN   Squamous Epithelial / LPF 0-5 0 - 5   Mucus PRESENT     Assessment: 27 y.o. G2P0101 [redacted]w[redacted]d by 06/15/2018, by Last Menstrual Period with preterm contractions  Plan: 1) Fetal fibronectin collected in office; send to lab. UA to check for UTI. No cervical change so far in triage.  2) Fetus - FWB reassuring, continue to monitor  3) PNL - Blood type O/Positive/-- (07/10 0948) / Anti-bodyscreen Negative (07/10 0948) / Rubella <0.90 (07/10 0948) / Varicella Immune / RPR Non Reactive (11/27 0934) / HBsAg Negative (07/10 1610) / HIV Non Reactive (11/27 0934)   4) Immunization History -  Immunization History  Administered Date(s) Administered  . Influenza Split 01/15/2012  . Influenza,inj,Quad PF,6+ Mos 01/28/2018  . Tdap 01/14/2012, 04/08/2018    5) Disposition - monitor for cervical change, betamethasone due to history of preterm delivery.  Marcelyn Bruins, CNM 04/23/2018

## 2018-04-23 NOTE — Addendum Note (Signed)
Addended by: Farrel ConnersGUTIERREZ, Tavi Hoogendoorn on: 04/23/2018 03:29 PM   Modules accepted: Orders

## 2018-04-24 ENCOUNTER — Encounter: Payer: Self-pay | Admitting: Certified Nurse Midwife

## 2018-04-24 ENCOUNTER — Other Ambulatory Visit: Payer: Self-pay | Admitting: Maternal Newborn

## 2018-04-24 DIAGNOSIS — R8789 Other abnormal findings in specimens from female genital organs: Secondary | ICD-10-CM

## 2018-04-24 DIAGNOSIS — O09899 Supervision of other high risk pregnancies, unspecified trimester: Secondary | ICD-10-CM

## 2018-04-24 DIAGNOSIS — O4700 False labor before 37 completed weeks of gestation, unspecified trimester: Secondary | ICD-10-CM

## 2018-04-24 DIAGNOSIS — O26899 Other specified pregnancy related conditions, unspecified trimester: Secondary | ICD-10-CM

## 2018-04-24 DIAGNOSIS — R110 Nausea: Principal | ICD-10-CM

## 2018-04-24 MED ORDER — ACETAMINOPHEN 500 MG PO TABS
ORAL_TABLET | ORAL | Status: AC
  Filled 2018-04-24: qty 2

## 2018-04-24 MED ORDER — ACETAMINOPHEN 500 MG PO TABS: 1000.0000 mg | ORAL_TABLET | Freq: Four times a day (QID) | ORAL | 0 refills | Status: DC | PRN

## 2018-04-24 MED ORDER — FAMOTIDINE 20 MG PO TABS: 20.0000 mg | ORAL_TABLET | Freq: Two times a day (BID) | ORAL | 1 refills | Status: AC

## 2018-04-24 MED ORDER — FAMOTIDINE 40 MG/5ML PO SUSR
20.0000 mg | ORAL | Status: AC
  Administered 2018-04-24: 20 mg via ORAL
  Filled 2018-04-24: qty 2.5

## 2018-04-24 MED ORDER — ACETAMINOPHEN 500 MG PO TABS
1000.0000 mg | ORAL_TABLET | Freq: Four times a day (QID) | ORAL | Status: DC | PRN
  Administered 2018-04-24: 1000 mg via ORAL

## 2018-04-24 MED ORDER — FAMOTIDINE 20 MG PO TABS
20.0000 mg | ORAL_TABLET | Freq: Two times a day (BID) | ORAL | Status: DC
  Administered 2018-04-24: 20 mg via ORAL
  Filled 2018-04-24: qty 1

## 2018-04-24 MED ORDER — NIFEDIPINE 10 MG PO CAPS
10.0000 mg | ORAL_CAPSULE | Freq: Four times a day (QID) | ORAL | Status: DC
  Administered 2018-04-24: 10 mg via ORAL

## 2018-04-24 MED ORDER — NIFEDIPINE 10 MG PO CAPS: 10.0000 mg | ORAL_CAPSULE | Freq: Four times a day (QID) | ORAL | 0 refills | Status: DC

## 2018-04-24 MED ORDER — ONDANSETRON 4 MG PO TBDP
8.0000 mg | ORAL_TABLET | ORAL | Status: AC
  Administered 2018-04-24: 8 mg via ORAL
  Filled 2018-04-24: qty 2

## 2018-04-24 MED ORDER — DOXYLAMINE-PYRIDOXINE 10-10 MG PO TBEC: 2.0000 | DELAYED_RELEASE_TABLET | Freq: Every day | ORAL | 1 refills | Status: DC

## 2018-04-24 NOTE — Discharge Instructions (Signed)
Activity Restriction During Pregnancy Your health care provider may recommend specific activity restrictions during pregnancy for a variety of reasons. Activity restriction may require that you limit activities that require great effort, such as exercise, lifting, or sex. The type of activity restriction will vary for each person, depending on your risk or the problems you are having. Activity restriction may be recommended for a period of time until your baby is delivered. Why are activity restrictions recommended? Activity restriction may be recommended if:  Your placenta is partially or completely covering the opening of your cervix (placenta previa).  There is bleeding between the wall of the uterus and the amniotic sac in the first trimester of pregnancy (subchorionic hemorrhage).  You went into labor too early (preterm labor).  You have a history of miscarriage.  You have a condition that causes high blood pressure during pregnancy (preeclampsia or eclampsia).  You are pregnant with more than one baby.  Your baby is not growing well.  Talk with your health care team about activity restriction to decide if it is best for you and your baby. Even if you are having problems during your pregnancy, you may be able to continue with normal levels of activity with careful monitoring by your health care team. Follow these instructions at home: If needed, based on your overall health and the health of your baby, your health care provider will decide which type of activity restriction is right for you. Activity restrictions may include:  Not lifting anything heavier than 10 pounds (4.5 kg).  Avoiding activities that take a lot of physical effort.  No lifting or straining.  Resting in a sitting position or lying down for periods of time during the day. Pelvic rest may be recommended along with activity restrictions. If pelvic rest is recommended, then:  Do not have sex, an orgasm, or use  sexual stimulation.  Do not use tampons. Do not douche. Do not put anything into your vagina.  Do not lift anything that is heavier than 10 lb (4.5 kg).  Avoid activities that require a lot of effort.  Avoid any activity in which your pelvic muscles could become strained, such as squatting. Questions to ask your health care provider  Why is my activity being limited?  How will activity restrictions affect my body?  Why is rest helpful for me and my baby?  What activities can I do?  When can I return to normal activities? When should I seek immediate medical care? Seek immediate medical care if you have:  Vaginal bleeding.  Vaginal discharge.  Cramping pain in your lower abdomen.  Regular contractions.  A low, dull backache. Summary  Your health care provider may recommend specific activity restrictions during pregnancy for a variety of reasons.  Activity restriction may require that you limit activities such as exercise, lifting, sex, or any other activity that requires great effort.  Discuss the risks and benefits of activity restriction with your health care team to decide if it is best for you and your baby.  Contact your health care provider right away if you think you are having contractions, or if you notice vaginal bleeding, discharge, or cramping. This information is not intended to replace advice given to you by your health care provider. Make sure you discuss any questions you have with your health care provider. Document Released: 08/10/2010 Document Revised: 08/05/2017 Document Reviewed: 08/05/2017 Elsevier Interactive Patient Education  2019 [employer] Preterm Labor and Birth Information Pregnancy normally lasts 39-41 weeks.  Preterm labor is when labor starts early. It starts before you have been pregnant for 37 whole weeks. What are the risk factors for preterm labor? Preterm labor is more likely to occur in women who:  Have an infection while  pregnant.  Have a cervix that is short.  Have gone into preterm labor before.  Have had surgery on their cervix.  Are younger than age 27.  Are older than age 27.  Are African American.  Are pregnant with two or more babies.  Take street drugs while pregnant.  Smoke while pregnant.  Do not gain enough weight while pregnant.  Got pregnant right after another pregnancy. What are the symptoms of preterm labor? Symptoms of preterm labor include:  Cramps. The cramps may feel like the cramps some women get during their period. The cramps may happen with watery poop (diarrhea).  Pain in the belly (abdomen).  Pain in the lower back.  Regular contractions or tightening. It may feel like your belly is getting tighter.  Pressure in the lower belly that seems to get stronger.  More fluid (discharge) leaking from the vagina. The fluid may be watery or bloody.  Water breaking. Why is it important to notice signs of preterm labor? Babies who are born early may not be fully developed. They have a higher chance for:  Long-term heart problems.  Long-term lung problems.  Trouble controlling body systems, like breathing.  Bleeding in the brain.  A condition called cerebral palsy.  Learning difficulties.  Death. These risks are highest for babies who are born before 34 weeks of pregnancy. How is preterm labor treated? Treatment depends on:  How long you were pregnant.  Your condition.  The health of your baby. Treatment may involve:  Having a stitch (suture) placed in your cervix. When you give birth, your cervix opens so the baby can come out. The stitch keeps the cervix from opening too soon.  Staying at the hospital.  Taking or getting medicines, such as: ? Hormone medicines. ? Medicines to stop contractions. ? Medicines to help the babys lungs develop. ? Medicines to prevent your baby from having cerebral palsy. What should I do if I am in preterm labor? If  you think you are going into labor too soon, call your doctor right away. How can I prevent preterm labor?  Do not use any tobacco products. ? Examples of these are cigarettes, chewing tobacco, and e-cigarettes. ? If you need help quitting, ask your doctor.  Do not use street drugs.  Do not use any medicines unless you ask your doctor if they are safe for you.  Talk with your doctor before taking any herbal supplements.  Make sure you gain enough weight.  Watch for infection. If you think you might have an infection, get it checked right away.  If you have gone into preterm labor before, tell your doctor. This information is not intended to replace advice given to you by your health care provider. Make sure you discuss any questions you have with your health care provider. Document Released: 07/12/2008 Document Revised: 09/26/2015 Document Reviewed: 09/06/2015 Elsevier Interactive Patient Education  2019 [employer]

## 2018-04-24 NOTE — Final Progress Note (Signed)
Physician Final Progress Note  Patient ID: Dewitt Hoesngela Buda MRN: [id number] DOB/AGE: Sep 02, 1990 27 y.o.  Admit date: 04/23/2018 Admitting provider: Conard NovakStephen D Jackson, MD? Gasper Lloydolleen L. Nyomie Ehrlich,CNM Discharge date: 04/24/2018   Admission Diagnoses: Threatened preterm labor at 32 weeks  Discharge Diagnoses:  Active Problems:   Positive fetal fibronectin at 22 weeks to [redacted] weeks gestation   Threatened preterm labor    IUP at 32wk4d   Consults: None  Significant Findings/ Diagnostic Studies: 27 y.o. G2P0101 at 6161w3d by LMP was admitted for observation after presenting to clinic with intermittent lower abdominal pain and lower back pain that had been ongoing for two weeks but had gotten worse over the past couple of days. She was feeling pain about every 3-5 minutes and rated it as 4/10. She had had some vaginal discharge and was diagnosed with a yeast infection in the clinic. She had a history of a preterm delivery at 7915w4d and has been receiving Makena during this pregnancy. She denied any vaginal bleeding. Cervical exam in office was 1/60/-1.  On presentation to the L&D unit, she was having mild contractions every 1-2 minutes and they were lasting 30-50sec. Fetal fibronectin that was collected in the clinic was positive.  Urinalysis was normal. Fetal heart tracing was Cat 1. She was given po hydration and cervix did not change, but she continued to have frequent contractions making her uncomfortable. She was given  Procardia 20 mgm which was effective in reducing the frequency of contractions and made her more comfortable.  She was given two doses of betamethasone prior to discharge 04/24/2018. Her cervix had changed minimally at time of discharge (1.5/60%/-2). She will continue on Procardia 10 mgm every 6 hours until 34 weeks of pregnancy. [facility]/27/2018.   Procedures: none  Discharge Condition: stable  Disposition: Discharge disposition: 01-Home or Self  Care       Diet: Regular diet  Discharge Activity: pelvic rest  Discharge Instructions    Discharge patient   Complete by:  As directed    Discharge disposition:  01-Home or Self Care   Discharge patient date:  04/24/2018     Allergies as of 04/24/2018   No Known Allergies     Medication List    STOP taking these medications   metoprolol succinate 25 MG 24 hr tablet Commonly known as:  TOPROL-XL     TAKE these medications   acetaminophen 500 MG tablet Commonly known as:  TYLENOL Take 2 tablets (1,000 mg total) by mouth every 6 (six) hours as needed for moderate pain.   Doxylamine-Pyridoxine 10-10 MG Tbec Commonly known as:  DICLEGIS Take 2 tablets by mouth at bedtime. If symptoms persist, add one tablet in the morning and one in the afternoon   famotidine 20 MG tablet Commonly known as:  PEPCID Take 1 tablet (20 mg total) by mouth 2 (two) times daily.   hydroxyprogesterone caproate 250 mg/mL Oil injection Commonly known as:  MAKENA Inject 1 mL (250 mg total) into the muscle once for 1 dose.   NIFEdipine 10 MG capsule Commonly known as:  PROCARDIA Take 1 capsule (10 mg total) by mouth every 6 (six) hours for 7 days. Start taking on:  April 25, 2018   terconazole 80 MG vaginal suppository Commonly known as:  TERAZOL 3 Place 1 suppository (80 mg total) vaginally at bedtime for 3 days.   ZOLOFT 50 MG tablet Generic drug:  sertraline Take 1 tablet by mouth daily.  Total time spent taking care of this patient: 20 minutes  Signed: Farrel Connersolleen Jeriah Corkum 04/24/2018, 3:44 PM

## 2018-04-25 LAB — URINE CULTURE

## 2018-04-27 ENCOUNTER — Encounter: Payer: Self-pay | Admitting: Obstetrics and Gynecology

## 2018-04-27 ENCOUNTER — Ambulatory Visit (INDEPENDENT_AMBULATORY_CARE_PROVIDER_SITE_OTHER): Payer: Medicaid Other | Admitting: Obstetrics and Gynecology

## 2018-04-27 VITALS — BP 110/70 | Wt 187.0 lb

## 2018-04-27 DIAGNOSIS — O09899 Supervision of other high risk pregnancies, unspecified trimester: Secondary | ICD-10-CM

## 2018-04-27 DIAGNOSIS — Z8751 Personal history of pre-term labor: Secondary | ICD-10-CM

## 2018-04-27 DIAGNOSIS — O99343 Other mental disorders complicating pregnancy, third trimester: Secondary | ICD-10-CM

## 2018-04-27 DIAGNOSIS — F329 Major depressive disorder, single episode, unspecified: Secondary | ICD-10-CM

## 2018-04-27 DIAGNOSIS — O4703 False labor before 37 completed weeks of gestation, third trimester: Secondary | ICD-10-CM

## 2018-04-27 DIAGNOSIS — Z3A33 33 weeks gestation of pregnancy: Secondary | ICD-10-CM

## 2018-04-27 DIAGNOSIS — O99213 Obesity complicating pregnancy, third trimester: Secondary | ICD-10-CM

## 2018-04-27 DIAGNOSIS — O09213 Supervision of pregnancy with history of pre-term labor, third trimester: Secondary | ICD-10-CM

## 2018-04-27 DIAGNOSIS — F32A Depression, unspecified: Secondary | ICD-10-CM

## 2018-04-27 DIAGNOSIS — O99340 Other mental disorders complicating pregnancy, unspecified trimester: Secondary | ICD-10-CM

## 2018-04-27 DIAGNOSIS — R8789 Other abnormal findings in specimens from female genital organs: Secondary | ICD-10-CM

## 2018-04-27 DIAGNOSIS — O0990 Supervision of high risk pregnancy, unspecified, unspecified trimester: Secondary | ICD-10-CM

## 2018-04-27 DIAGNOSIS — O99333 Smoking (tobacco) complicating pregnancy, third trimester: Secondary | ICD-10-CM

## 2018-04-27 LAB — FETAL NONSTRESS TEST

## 2018-04-27 NOTE — Progress Notes (Signed)
Routine Prenatal Care Visit  Subjective  Kimberly Haas is a 27 y.o. G2P0101 at 3934w0d being seen today for ongoing prenatal care.  She is currently monitored for the following issues for this high-risk pregnancy and has Chronic hypertension affecting pregnancy; Depression affecting pregnancy, antepartum; Supervision of high risk pregnancy, antepartum; Tobacco use during pregnancy; History of preterm delivery; Obesity affecting pregnancy; BMI 32.0-32.9,adult; Preterm uterine contractions; Positive fetal fibronectin at 22 weeks to [redacted] weeks gestation; and Threatened preterm labor on their problem list.  ----------------------------------------------------------------------------------- Patient reports pelvic pain and pressure.   Contractions: Irregular. Vag. Bleeding: None.  Movement: Present. Denies leaking of fluid.  ----------------------------------------------------------------------------------- The following portions of the patient's history were reviewed and updated as appropriate: allergies, current medications, past family history, past medical history, past social history, past surgical history and problem list. Problem list updated.   Objective  Blood pressure 110/70, weight 187 lb (84.8 kg), last menstrual period 09/08/2017. Pregravid weight 180 lb (81.6 kg) Total Weight Gain 7 lb (3.175 kg) Urinalysis:      Fetal Status: Fetal Heart Rate (bpm): 150 Fundal Height: 37 cm Movement: Present     General:  Alert, oriented and cooperative. Patient is in no acute distress.  Skin: Skin is warm and dry. No rash noted.   Cardiovascular: Normal heart rate noted  Respiratory: Normal respiratory effort, no problems with respiration noted  Abdomen: Soft, gravid, appropriate for gestational age. Pain/Pressure: Present     Pelvic:  Cervical exam performed Dilation: 1 Effacement (%): 60 Station: -3  Extremities: Normal range of motion.  Edema: None  Mental Status: Normal mood and affect.  Normal behavior. Normal judgment and thought content.   NST: 150 bpm baseline, moderate variability, 15x15 accelerations, no decelerations. REACTIVE  Assessment   27 y.o. G2P0101 at 4434w0d by  06/15/2018, by Last Menstrual Period presenting for routine prenatal visit  Plan   Pregnancy #2 Problems (from 10/23/17 to present)    Problem Noted Resolved   Obesity affecting pregnancy 04/08/2018 by Conard NovakJackson, Stephen D, MD No   BMI 32.0-32.9,adult 04/08/2018 by Conard NovakJackson, Stephen D, MD No   History of preterm delivery 12/03/2017 by Vena AustriaStaebler, Andreas, MD No   Overview Addendum 12/31/2017  9:55 AM by Vena AustriaStaebler, Andreas, MD    Makena candidate 35 week PPROM [X]  Baseline cervical length 16 weeks - 3.82cm      Tobacco use during pregnancy 11/05/2017 by Oswaldo ConroySchmid, Jacelyn Y, CNM No   Chronic hypertension affecting pregnancy 10/23/2017 by Tresea MallGledhill, Jane, CNM No   Overview Addendum 12/03/2017 10:18 AM by Vena AustriaStaebler, Andreas, MD    [X]  Aspirin 81 mg daily after 12 weeks; discontinue after 36 weeks [ ]  baseline labs with CBC, CMP, urine protein/creatinine ratio [ ]  ultrasound for growth at 28, 32, 36 weeks   Current antihypertensives:  metoprolol 25mg    Baseline and surveillance labs (pulled in from [facility], refresh links as needed)  Lab Results  Component Value Date   PLT 361 11/05/2017   CREATININE 0.61 03/02/2012   AST 40 (H) 03/02/2012   ALT 54 (H) 03/02/2012    Antenatal Testing CHTN - O10.919  Group I  BP < 140/90, no preeclampsia, AGA,  nml AFV, +/- meds    Group II BP > 140/90, on meds, no preeclampsia, AGA, nml AFV  20-28-34-38  20-24-28-32-35-38  32//2 x wk  28//BPP wkly then 32//2 x wk  40 no meds; 39 meds  PRN or 37  Pre-eclampsia  GHTN - O13.9/Preeclampsia without severe features  - O14.00  Preeclampsia with severe features - O14.10  Q 3-4wks  Q 2 wks  28//BPP wkly then 32//2 x wk  Inpatient  37  PRN or 34         Depression affecting pregnancy, antepartum 10/23/2017 by  Tresea MallGledhill, Jane, CNM No   Supervision of high risk pregnancy, antepartum 10/23/2017 by Tresea MallGledhill, Jane, CNM No   Overview Addendum 04/23/2018  1:38 PM by Farrel ConnersGutierrez, Colleen, CNM    Clinic [facility] Prenatal Labs  Dating L=8 Blood type: O/Positive/-- (07/10 0948)   Genetic Screen NIPT: Normal XX Inheritest: negative SMA, CF, and Fragile X Antibody:Negative (07/10 0948)  Anatomic US Normal 01/28/18 Rubella: <0.90 (07/10 0948) Varicella: Immune  GTT 146; 3 hr WNL: 79/103/126/134 RPR: Non Reactive (07/10 0948)   Rhogam N/A HBsAg: Negative (07/10 0948)   TDaP vaccine           04/08/18            Flu Shot:01/28/18 HIV: Non Reactive (07/10 0948)   Baby Food                                GBS:   Contraception  Pap: 09/11/2017, ASCUS/ HPV Negative  CBB     CS/VBAC    Support Person Boyfriend [name]               Gestational age appropriate obstetric precautions including but not limited to vaginal bleeding, contractions, leaking of fluid and fetal movement were reviewed in detail with the patient.    Discussed being off of work for contractions, she is not interested at this time. Working is her only source of income and she is a single mother so she is supporting her other child. She said she would like to work until she is in labor.  GBS collected today for concerns of preterm birth. Despite contractions she had not made any cervical change at this time.  Return in about 3 days (around 04/30/2018) for ROB- makena injection.  Natale Milchhristanna R Schuman MD [facility], [facility] 04/27/2018, 3:58 PM

## 2018-04-27 NOTE — Addendum Note (Signed)
Addended by: Adelene IdlerSCHUMAN, CHRISTANNA on: 04/27/2018 04:04 PM   Modules accepted: Orders

## 2018-04-27 NOTE — Progress Notes (Signed)
ROB/NST C/o contractions on and off for the last 2 weeks, Denies lof, good FM, pelvic pressure

## 2018-04-29 NOTE — L&D Delivery Note (Signed)
Obstetrical Delivery Note   Date of Delivery:   05/13/2018 Primary OB:   [facility] Gestational Age/EDD: 7241w2d (Dated by LMP) Antepartum complications: Preterm labor and delivery with first pregnancy, received Makena until 34 weeks. Preterm contractions this pregnancy, CHTN on metaprolol, MJ use in early pregnancy  Delivered By:   Farrel Connersolleen Cyndi Montejano, CNM  Delivery Type:   spontaneous vaginal delivery  Procedure Details:   Progressed rapidly after AROM of forebag. Was C/C/+1 to +2 on recheck. Mother pushed twice and delivered a vigorous female infant in [place]. Good cry. Baby dried and placed on mother's abdomen. After a delayed cord clamping the cord was cut by grandmother. Baby placed skin to skin on mother's chest. Spontaneous delivery of intact placenta and 3 vessel cord. There was an extra lobe of the placenta and the cord insertion was velamentous. There was lower uterine segment atony which was treated with IV Pitocin, Cytotec 800 mcg PR, and bimanual massage with removal of clots from lower uterine segment. Bleeding became more brisk and patient was given Methergine 0.2 mgm IM while performing bimanual. After bleeding slowed, a small second degree laceration at introitus was repaired with 3-0 Chromic. EBL 1174 ml Anesthesia:    epidural Intrapartum complications: Postpartum Hemorrhage, PPROM  and Preterm Labor GBS:    negative Laceration:    2nd degree at introitus Episiotomy:    none Placenta:    Via active 3rd stage. To pathology: yes Estimated Blood Loss:  1174 ml Baby:    Liveborn female, Apgars 9/9, weight pending    Farrel Connersolleen Teriana Danker, CNM

## 2018-04-30 ENCOUNTER — Ambulatory Visit (INDEPENDENT_AMBULATORY_CARE_PROVIDER_SITE_OTHER): Payer: Medicaid Other | Admitting: Obstetrics and Gynecology

## 2018-04-30 ENCOUNTER — Ambulatory Visit: Payer: Medicaid Other

## 2018-04-30 ENCOUNTER — Encounter: Payer: Self-pay | Admitting: Obstetrics and Gynecology

## 2018-04-30 VITALS — BP 118/76 | Wt 186.0 lb

## 2018-04-30 DIAGNOSIS — O10913 Unspecified pre-existing hypertension complicating pregnancy, third trimester: Secondary | ICD-10-CM

## 2018-04-30 DIAGNOSIS — Z3A33 33 weeks gestation of pregnancy: Secondary | ICD-10-CM

## 2018-04-30 DIAGNOSIS — O09213 Supervision of pregnancy with history of pre-term labor, third trimester: Secondary | ICD-10-CM | POA: Diagnosis not present

## 2018-04-30 DIAGNOSIS — Z6832 Body mass index (BMI) 32.0-32.9, adult: Secondary | ICD-10-CM

## 2018-04-30 DIAGNOSIS — O10919 Unspecified pre-existing hypertension complicating pregnancy, unspecified trimester: Secondary | ICD-10-CM

## 2018-04-30 DIAGNOSIS — Z8751 Personal history of pre-term labor: Secondary | ICD-10-CM

## 2018-04-30 DIAGNOSIS — O0990 Supervision of high risk pregnancy, unspecified, unspecified trimester: Secondary | ICD-10-CM

## 2018-04-30 DIAGNOSIS — O99340 Other mental disorders complicating pregnancy, unspecified trimester: Secondary | ICD-10-CM

## 2018-04-30 DIAGNOSIS — O99343 Other mental disorders complicating pregnancy, third trimester: Secondary | ICD-10-CM

## 2018-04-30 DIAGNOSIS — O99213 Obesity complicating pregnancy, third trimester: Secondary | ICD-10-CM

## 2018-04-30 DIAGNOSIS — F329 Major depressive disorder, single episode, unspecified: Secondary | ICD-10-CM

## 2018-04-30 DIAGNOSIS — O99333 Smoking (tobacco) complicating pregnancy, third trimester: Secondary | ICD-10-CM

## 2018-04-30 MED ORDER — HYDROXYPROGESTERONE CAPROATE 250 MG/ML IM OIL
250.0000 mg | TOPICAL_OIL | Freq: Once | INTRAMUSCULAR | Status: AC
  Administered 2018-04-30: 250 mg via INTRAMUSCULAR

## 2018-04-30 NOTE — Progress Notes (Signed)
Routine Prenatal Care Visit  Subjective  Kimberly Haas is a 28 y.o. G2P0101 at [redacted]w[redacted]d being seen today for ongoing prenatal care.  She is currently monitored for the following issues for this high-risk pregnancy and has Chronic hypertension affecting pregnancy; Depression affecting pregnancy, antepartum; Supervision of high risk pregnancy, antepartum; Tobacco use during pregnancy; History of preterm delivery; Obesity affecting pregnancy; BMI 32.0-32.9,adult; Preterm uterine contractions; Positive fetal fibronectin at 22 weeks to [redacted] weeks gestation; and Threatened preterm labor on their problem list.  ----------------------------------------------------------------------------------- Patient reports irregular contractions, sometimes painful.   Contractions: Irregular. Vag. Bleeding: None.  Movement: Present. Denies leaking of fluid.  ----------------------------------------------------------------------------------- The following portions of the patient's history were reviewed and updated as appropriate: allergies, current medications, past family history, past medical history, past social history, past surgical history and problem list. Problem list updated.   Objective  Blood pressure 118/76, weight 186 lb (84.4 kg), last menstrual period 09/08/2017. Pregravid weight 180 lb (81.6 kg) Total Weight Gain 6 lb (2.722 kg) Urinalysis: Urine Protein    Urine Glucose    Fetal Status: Fetal Heart Rate (bpm): 140 Fundal Height: 34 cm Movement: Present     General:  Alert, oriented and cooperative. Patient is in no acute distress.  Skin: Skin is warm and dry. No rash noted.   Cardiovascular: Normal heart rate noted  Respiratory: Normal respiratory effort, no problems with respiration noted  Abdomen: Soft, gravid, appropriate for gestational age. Pain/Pressure: Present     Pelvic:  Cervical exam performed Dilation: 1.5 Effacement (%): 40 Station: -3  Extremities: Normal range of motion.  Edema: None   Mental Status: Normal mood and affect. Normal behavior. Normal judgment and thought content.   Assessment   28 y.o. G2P0101 at [redacted]w[redacted]d by  06/15/2018, by Last Menstrual Period presenting for routine prenatal visit  Plan   Pregnancy #2 Problems (from 10/23/17 to present)    Problem Noted Resolved   Obesity affecting pregnancy 04/08/2018 by Conard Novak, MD No   BMI 32.0-32.9,adult 04/08/2018 by Conard Novak, MD No   History of preterm delivery 12/03/2017 by Vena Austria, MD No   Overview Addendum 12/31/2017  9:55 AM by Vena Austria, MD    Makena candidate 35 week PPROM [X]  Baseline cervical length 16 weeks - 3.82cm      Tobacco use during pregnancy 11/05/2017 by Oswaldo Conroy, CNM No   Chronic hypertension affecting pregnancy 10/23/2017 by Tresea Mall, CNM No   Overview Addendum 12/03/2017 10:18 AM by Vena Austria, MD    [X]  Aspirin 81 mg daily after 12 weeks; discontinue after 36 weeks [ ]  baseline labs with CBC, CMP, urine protein/creatinine ratio [ ]  ultrasound for growth at 28, 32, 36 weeks   Current antihypertensives:  metoprolol 25mg    Baseline and surveillance labs (pulled in from [facility], refresh links as needed)  Lab Results  Component Value Date   PLT 361 11/05/2017   CREATININE 0.61 03/02/2012   AST 40 (H) 03/02/2012   ALT 54 (H) 03/02/2012    Antenatal Testing CHTN - O10.919  Group I  BP < 140/90, no preeclampsia, AGA,  nml AFV, +/- meds    Group II BP > 140/90, on meds, no preeclampsia, AGA, nml AFV  20-28-34-38  20-24-28-32-35-38  32//2 x wk  28//BPP wkly then 32//2 x wk  40 no meds; 39 meds  PRN or 37  Pre-eclampsia  GHTN - O13.9/Preeclampsia without severe features  - O14.00   Preeclampsia with severe features - O14.10  Q 3-4wks  Q 2 wks  28//BPP wkly then 32//2 x wk  Inpatient  37  PRN or 34         Depression affecting pregnancy, antepartum 10/23/2017 by Tresea Mall, CNM No   Supervision of high risk  pregnancy, antepartum 10/23/2017 by Tresea Mall, CNM No   Overview Addendum 04/23/2018  1:38 PM by Farrel Conners, CNM    Clinic [facility] Prenatal Labs  Dating L=8 Blood type: O/Positive/-- (07/10 0948)   Genetic Screen NIPT: Normal XX Inheritest: negative SMA, CF, and Fragile X Antibody:Negative (07/10 0948)  Anatomic [place] Normal 01/28/18 Rubella: <0.90 (07/10 0948) Varicella: Immune  GTT 146; 3 hr WNL: 79/103/126/134 RPR: Non Reactive (07/10 0948)   Rhogam N/A HBsAg: Negative (07/10 0948)   TDaP vaccine           04/08/18            Flu Shot:01/28/18 HIV: Non Reactive (07/10 0948)   Baby Food                                GBS:   Contraception  Pap: 09/11/2017, ASCUS/ HPV Negative  CBB     CS/VBAC    Support Person Boyfriend [name]               Preterm labor symptoms and general obstetric precautions including but not limited to vaginal bleeding, contractions, leaking of fluid and fetal movement were reviewed in detail with the patient. Please refer to After Visit Summary for other counseling recommendations.   Return in about 4 days (around 05/04/2018) for u/s for growth/afi and routine prenatal/NST.  Thomasene Mohair, MD, [facility], [facility] 04/30/2018 10:14 AM

## 2018-04-30 NOTE — Addendum Note (Signed)
Addended by: Liliane Shi on: 04/30/2018 11:37 AM   Modules accepted: Orders

## 2018-05-01 LAB — CULTURE, BETA STREP (GROUP B ONLY): Strep Gp B Culture: NEGATIVE

## 2018-05-04 ENCOUNTER — Ambulatory Visit (INDEPENDENT_AMBULATORY_CARE_PROVIDER_SITE_OTHER): Payer: Medicaid Other | Admitting: Obstetrics and Gynecology

## 2018-05-04 ENCOUNTER — Encounter: Payer: Self-pay | Admitting: Obstetrics and Gynecology

## 2018-05-04 ENCOUNTER — Ambulatory Visit (INDEPENDENT_AMBULATORY_CARE_PROVIDER_SITE_OTHER): Payer: Medicaid Other

## 2018-05-04 VITALS — BP 112/70 | Wt 182.0 lb

## 2018-05-04 DIAGNOSIS — O99213 Obesity complicating pregnancy, third trimester: Secondary | ICD-10-CM | POA: Diagnosis not present

## 2018-05-04 DIAGNOSIS — O0990 Supervision of high risk pregnancy, unspecified, unspecified trimester: Secondary | ICD-10-CM

## 2018-05-04 DIAGNOSIS — Z3A34 34 weeks gestation of pregnancy: Secondary | ICD-10-CM

## 2018-05-04 DIAGNOSIS — O10919 Unspecified pre-existing hypertension complicating pregnancy, unspecified trimester: Secondary | ICD-10-CM

## 2018-05-04 DIAGNOSIS — O10913 Unspecified pre-existing hypertension complicating pregnancy, third trimester: Secondary | ICD-10-CM

## 2018-05-04 DIAGNOSIS — O09213 Supervision of pregnancy with history of pre-term labor, third trimester: Secondary | ICD-10-CM

## 2018-05-04 DIAGNOSIS — Z8751 Personal history of pre-term labor: Secondary | ICD-10-CM

## 2018-05-04 DIAGNOSIS — O99333 Smoking (tobacco) complicating pregnancy, third trimester: Secondary | ICD-10-CM

## 2018-05-04 NOTE — Progress Notes (Signed)
Routine Prenatal Care Visit  Subjective  Kimberly Haas is a 28 y.o. G2P0101 at 4445w0d being seen today for ongoing prenatal care.  She is currently monitored for the following issues for this high-risk pregnancy and has Chronic hypertension affecting pregnancy; Depression affecting pregnancy, antepartum; Supervision of high risk pregnancy, antepartum; Tobacco use during pregnancy; History of preterm delivery; Obesity affecting pregnancy; BMI 32.0-32.9,adult; Preterm uterine contractions; Positive fetal fibronectin at 22 weeks to [redacted] weeks gestation; and Threatened preterm labor on their problem list.  ----------------------------------------------------------------------------------- Patient reports continued contractions, same as before. Declines cervical examination. .   Contractions: Irregular. Vag. Bleeding: None.  Movement: Present. Denies leaking of fluid.  ----------------------------------------------------------------------------------- The following portions of the patient's history were reviewed and updated as appropriate: allergies, current medications, past family history, past medical history, past social history, past surgical history and problem list. Problem list updated.   Objective  Blood pressure 112/70, weight 182 lb (82.6 kg), last menstrual period 09/08/2017. Pregravid weight 180 lb (81.6 kg) Total Weight Gain 2 lb (0.907 kg) Urinalysis:      Fetal Status: Fetal Heart Rate (bpm): 140 Fundal Height: 39 cm Movement: Present     General:  Alert, oriented and cooperative. Patient is in no acute distress.  Skin: Skin is warm and dry. No rash noted.   Cardiovascular: Normal heart rate noted  Respiratory: Normal respiratory effort, no problems with respiration noted  Abdomen: Soft, gravid, appropriate for gestational age. Pain/Pressure: Present     Pelvic:  Cervical exam deferred        Extremities: Normal range of motion.  Edema: None  Mental Status: Normal mood and  affect. Normal behavior. Normal judgment and thought content.     Assessment   28 y.o. G2P0101 at 6345w0d by  06/15/2018, by Last Menstrual Period presenting for routine prenatal visit  Plan   Pregnancy #2 Problems (from 10/23/17 to present)    Problem Noted Resolved   Obesity affecting pregnancy 04/08/2018 by Conard NovakJackson, Stephen D, MD No   BMI 32.0-32.9,adult 04/08/2018 by Conard NovakJackson, Stephen D, MD No   History of preterm delivery 12/03/2017 by Vena AustriaStaebler, Andreas, MD No   Overview Addendum 12/31/2017  9:55 AM by Vena AustriaStaebler, Andreas, MD    Makena candidate 35 week PPROM [X]  Baseline cervical length 16 weeks - 3.82cm      Tobacco use during pregnancy 11/05/2017 by Oswaldo ConroySchmid, Jacelyn Y, CNM No   Chronic hypertension affecting pregnancy 10/23/2017 by Tresea MallGledhill, Jane, CNM No   Overview Addendum 12/03/2017 10:18 AM by Vena AustriaStaebler, Andreas, MD    [X]  Aspirin 81 mg daily after 12 weeks; discontinue after 36 weeks [ ]  baseline labs with CBC, CMP, urine protein/creatinine ratio [ ]  ultrasound for growth at 28, 32, 36 weeks   Current antihypertensives:  metoprolol 25mg    Baseline and surveillance labs ([facility], refresh links as needed)  Lab Results  Component Value Date   PLT 361 11/05/2017   CREATININE 0.61 03/02/2012   AST 40 (H) 03/02/2012   ALT 54 (H) 03/02/2012    Antenatal Testing CHTN - O10.919  Group I  BP < 140/90, no preeclampsia, AGA,  nml AFV, +/- meds    Group II BP > 140/90, on meds, no preeclampsia, AGA, nml AFV  20-28-34-38  20-24-28-32-35-38  32//2 x wk  28//BPP wkly then 32//2 x wk  40 no meds; 39 meds  PRN or 37  Pre-eclampsia  GHTN - O13.9/Preeclampsia without severe features  - O14.00   Preeclampsia with severe features -  O14.10  Q 3-4wks  Q 2 wks  28//BPP wkly then 32//2 x wk  Inpatient  37  PRN or 34         Depression affecting pregnancy, antepartum 10/23/2017 by Tresea Mall, CNM No   Supervision of high risk pregnancy, antepartum 10/23/2017 by  Tresea Mall, CNM No   Overview Addendum 04/23/2018  1:38 PM by Farrel Conners, CNM    [facility] Prenatal Labs  Dating L=8 Blood type: O/Positive/-- (07/10 0948)   Genetic Screen NIPT: Normal XX Inheritest: negative SMA, CF, and Fragile X Antibody:Negative (07/10 0948)  Anatomic [place] Normal 01/28/18 Rubella: <0.90 (07/10 0948) Varicella: Immune  GTT 146; 3 hr WNL: 79/103/126/134 RPR: Non Reactive (07/10 0948)   Rhogam N/A HBsAg: Negative (07/10 0948)   TDaP vaccine           04/08/18            Flu Shot:01/28/18 HIV: Non Reactive (07/10 0948)   Baby Food                                GBS:   Contraception  Pap: 09/11/2017, ASCUS/ HPV Negative  CBB     CS/VBAC    Support Person Boyfriend [name]               Gestational age appropriate obstetric precautions including but not limited to vaginal bleeding, contractions, leaking of fluid and fetal movement were reviewed in detail with the patient.    Growth [place] today Given information about Doula program and breast feeding. Discussed her plans for Nexplanon postpartum  Return in about 1 week (around 05/11/2018) for ROB , [place],  NST.  Natale Milch MD [facility], [facility] 05/04/2018, 3:25 PM

## 2018-05-04 NOTE — Progress Notes (Signed)
ROB/US/NST C/o still having contractions, feels like baby has moved

## 2018-05-06 ENCOUNTER — Ambulatory Visit (INDEPENDENT_AMBULATORY_CARE_PROVIDER_SITE_OTHER): Payer: Medicaid Other

## 2018-05-06 DIAGNOSIS — Z8751 Personal history of pre-term labor: Secondary | ICD-10-CM

## 2018-05-06 DIAGNOSIS — Z3A34 34 weeks gestation of pregnancy: Secondary | ICD-10-CM | POA: Diagnosis not present

## 2018-05-06 DIAGNOSIS — O09213 Supervision of pregnancy with history of pre-term labor, third trimester: Secondary | ICD-10-CM

## 2018-05-06 MED ORDER — HYDROXYPROGESTERONE CAPROATE 250 MG/ML IM OIL
250.0000 mg | TOPICAL_OIL | Freq: Once | INTRAMUSCULAR | Status: AC
  Administered 2018-05-06: 250 mg via INTRAMUSCULAR

## 2018-05-08 ENCOUNTER — Other Ambulatory Visit: Payer: Self-pay

## 2018-05-08 ENCOUNTER — Observation Stay
Admission: EM | Admit: 2018-05-08 | Discharge: 2018-05-08 | Disposition: A | Discharge status: Home / Self Care | Payer: Medicaid Other | Attending: Certified Nurse Midwife | Admitting: Certified Nurse Midwife

## 2018-05-08 DIAGNOSIS — Z3A34 34 weeks gestation of pregnancy: Secondary | ICD-10-CM | POA: Insufficient documentation

## 2018-05-08 DIAGNOSIS — Z6832 Body mass index (BMI) 32.0-32.9, adult: Secondary | ICD-10-CM

## 2018-05-08 DIAGNOSIS — Z3A32 32 weeks gestation of pregnancy: Secondary | ICD-10-CM | POA: Diagnosis not present

## 2018-05-08 DIAGNOSIS — F329 Major depressive disorder, single episode, unspecified: Secondary | ICD-10-CM | POA: Diagnosis not present

## 2018-05-08 DIAGNOSIS — O99343 Other mental disorders complicating pregnancy, third trimester: Secondary | ICD-10-CM | POA: Insufficient documentation

## 2018-05-08 DIAGNOSIS — O99333 Smoking (tobacco) complicating pregnancy, third trimester: Secondary | ICD-10-CM

## 2018-05-08 DIAGNOSIS — O10913 Unspecified pre-existing hypertension complicating pregnancy, third trimester: Secondary | ICD-10-CM | POA: Diagnosis not present

## 2018-05-08 DIAGNOSIS — O99340 Other mental disorders complicating pregnancy, unspecified trimester: Secondary | ICD-10-CM

## 2018-05-08 DIAGNOSIS — O0990 Supervision of high risk pregnancy, unspecified, unspecified trimester: Secondary | ICD-10-CM

## 2018-05-08 DIAGNOSIS — O99213 Obesity complicating pregnancy, third trimester: Secondary | ICD-10-CM

## 2018-05-08 DIAGNOSIS — O4703 False labor before 37 completed weeks of gestation, third trimester: Secondary | ICD-10-CM | POA: Diagnosis present

## 2018-05-08 DIAGNOSIS — Z8751 Personal history of pre-term labor: Secondary | ICD-10-CM

## 2018-05-08 DIAGNOSIS — O09213 Supervision of pregnancy with history of pre-term labor, third trimester: Secondary | ICD-10-CM | POA: Diagnosis not present

## 2018-05-08 DIAGNOSIS — O10919 Unspecified pre-existing hypertension complicating pregnancy, unspecified trimester: Secondary | ICD-10-CM

## 2018-05-08 DIAGNOSIS — F419 Anxiety disorder, unspecified: Secondary | ICD-10-CM | POA: Diagnosis not present

## 2018-05-08 LAB — COMPREHENSIVE METABOLIC PANEL
ALK PHOS: 138 U/L — AB (ref 38–126)
ALT: 28 U/L (ref 0–44)
AST: 28 U/L (ref 15–41)
Albumin: 3 g/dL — ABNORMAL LOW (ref 3.5–5.0)
Anion gap: 9 (ref 5–15)
BUN: 5 mg/dL — ABNORMAL LOW (ref 6–20)
CALCIUM: 8.5 mg/dL — AB (ref 8.9–10.3)
CO2: 20 mmol/L — AB (ref 22–32)
Chloride: 104 mmol/L (ref 98–111)
Creatinine, Ser: 0.34 mg/dL — ABNORMAL LOW (ref 0.44–1.00)
GFR calc non Af Amer: >60 mL/min (ref >60)
Glucose, Bld: 83 mg/dL (ref 70–99)
Potassium: 3.3 mmol/L — ABNORMAL LOW (ref 3.5–5.1)
Sodium: 133 mmol/L — ABNORMAL LOW (ref 135–145)
Total Bilirubin: 0.4 mg/dL (ref 0.3–1.2)
Total Protein: 6.6 g/dL (ref 6.5–8.1)

## 2018-05-08 LAB — TYPE AND SCREEN
ABO/RH(D): O POS
Antibody Screen: NEGATIVE

## 2018-05-08 LAB — CBC
HCT: 35.2 % — ABNORMAL LOW (ref 36.0–46.0)
Hemoglobin: 12.4 g/dL (ref 12.0–15.0)
MCH: 32.8 pg (ref 26.0–34.0)
MCHC: 35.2 g/dL (ref 30.0–36.0)
MCV: 93.1 fL (ref 80.0–100.0)
NRBC: 0 % (ref 0.0–0.2)
Platelets: 301 10*3/uL (ref 150–400)
RBC: 3.78 MIL/uL — ABNORMAL LOW (ref 3.87–5.11)
RDW: 13.9 % (ref 11.5–15.5)
WBC: 13.4 10*3/uL — ABNORMAL HIGH (ref 4.0–10.5)

## 2018-05-08 LAB — PROTEIN / CREATININE RATIO, URINE
CREATININE, URINE: 116 mg/dL
Protein Creatinine Ratio: 0.09 mg/mg{Cre} (ref 0.00–0.15)
Total Protein, Urine: 11 mg/dL

## 2018-05-08 MED ORDER — OSELTAMIVIR PHOSPHATE 75 MG PO CAPS: 75.0000 mg | ORAL_CAPSULE | Freq: Every day | ORAL | 0 refills | Status: DC

## 2018-05-08 NOTE — OB Triage Note (Signed)
Patient presented to L&D with complaints of on going contractions that have been worsening since this morning.  States shes been leaking some fluid for a little over a week but doesn't thinks she's lost her mucus plug. Denies any vaginal bleeding or decreased fetal movement.

## 2018-05-08 NOTE — Final Progress Note (Signed)
Physician Final Progress Note  Patient ID: Kimberly Haas MRN: [id number] DOB/AGE: Sep 26, 1990 28 y.o.  Admit date: 05/08/2018 Admitting provider: Natale Milchhristanna R Schuman, MD Discharge date: 05/08/2018   Admission Diagnoses: Threatened preterm labor at 34wk4d Elevated blood pressure in pregnancy Chronic hypertension  Discharge Diagnoses:  IUP at 34wk4d with threatened preterm labor. Exposure to Influenza B. Elevated blood pressure Chronic hypertension  Consults: None  Significant Findings/ Diagnostic Studies: 28 y.o. G2P0101 at 8512w4d by  Last Menstrual Period presenting to L&D from work with intermittent lower abdominal pain and lower back pain that has intensified since 1230 today. She has also been leaking clearish fluid for a week.  She has a history of a preterm delivery at 6872w4d and has been receiving Makena during this pregnancy. She denies any vaginal bleeding. Last cervical exam 12/27 was 1.5/60/-2.  Kimberly Haas was hospitalized over night 12/26 to 12/27 with threatened preterm labor at [redacted] weeks gestation at which time she had a positive fetal fibronectin. She received betamethasone x 2 doses at that time and was started on Procardia which seemed to lessen her contractions and pain. She stopped the Procardia as directed at 34 weeks. Kimberly Haas's prenatal care is also remarkable for chronic hypertension and she has been on metoprolol except for when she was taking procardia. Her blood pressures have been well controlled on the metoprolol; however, when she presented to L&D her initial BP was 133/94. She also reported that her first child tested positive for influenza B today. Both she and her daughter received flu vaccine.   Her prenatal care is also remarkable for anxiety/depression (she takes Zoloft) and for the following: [facility] Prenatal Labs  Dating L=8 Blood type: O/Positive/-- (07/10 0948)   Genetic Screen NIPT: Normal XX Inheritest: negative SMA, CF, and Fragile X  Antibody:Negative (07/10 0948)  Anatomic US Normal 01/28/18 Rubella: <0.90 (07/10 0948) Varicella: Immune  GTT 146; 3 hr WNL: 79/103/126/134 RPR: Non Reactive (07/10 0948)   Rhogam N/A HBsAg: Negative (07/10 0948)   TDaP vaccine           04/08/18            Flu Shot:01/28/18 HIV: Non Reactive (07/10 0948)   Baby Food   Breast                             GBS:   Contraception  Nexplanon Pap: 09/11/2017, ASCUS/ HPV Negative  CBB     CS/VBAC    Support Person Boyfriend [name]    On arrival cervix was 2.5cm/60-70%/-1 to -2. She was monitored for 3-4 hours with no change in her cervix. FHR was 135 baseline with accelerations to 160s to 180s, moderate variability. Baby was very active.Contractions were mild, irregular and sometimes frequent, and sometimes looked more like uterine irritability. Wet prep, pooling and fern test were all negative. Preeclampsia labs were within normal limits and there were no further blood pressure elevations.  Patient given options to go home, stay at hospital for further monitoring, or start Procardia for the preterm contractions. Patient opted to go home. She was taught to return for worsening labor contractions, leakage of water, bleeding like a menses, or decreased fetal movement. She declined any Procardia to stop contractions. [facility] if N[facility]. Will start on prophylaxtic dose of Tamiflu. Recommended staying away from daughter, wash hands, wear mask when around daughter to try to prevent transmission of the flu.   Procedures: none  Discharge Condition: stable  Disposition: Discharge disposition: 01-Home or Self Care       Diet: Regular diet  Discharge Activity: Activity as tolerated   Allergies as of 05/08/2018   No Known Allergies     Medication List    STOP taking these medications   NIFEdipine 10 MG capsule Commonly known as:  PROCARDIA     TAKE these medications   acetaminophen 500 MG tablet Commonly known as:   TYLENOL Take 2 tablets (1,000 mg total) by mouth every 6 (six) hours as needed for moderate pain.   Doxylamine-Pyridoxine 10-10 MG Tbec Commonly known as:  DICLEGIS Take 2 tablets by mouth at bedtime. If symptoms persist, add one tablet in the morning and one in the afternoon   famotidine 20 MG tablet Commonly known as:  PEPCID Take 1 tablet (20 mg total) by mouth 2 (two) times daily.   hydroxyprogesterone caproate 250 mg/mL Oil injection Commonly known as:  MAKENA Inject 1 mL (250 mg total) into the muscle once for 1 dose.   metoprolol tartrate 25 MG tablet Commonly known as:  LOPRESSOR Take 25 mg by mouth 1 day or 1 dose.   oseltamivir 75 MG capsule Commonly known as:  TAMIFLU Take 1 capsule (75 mg total) by mouth daily for 10 days.   ZOLOFT 50 MG tablet Generic drug:  sertraline Take 1 tablet by mouth daily.        Total time spent taking care of this patient: 20 minutes  Signed: Farrel Conners 05/08/2018, 5:59 PM

## 2018-05-09 LAB — RPR: RPR Ser Ql: NONREACTIVE

## 2018-05-10 ENCOUNTER — Other Ambulatory Visit: Payer: Self-pay

## 2018-05-10 ENCOUNTER — Encounter: Payer: Self-pay | Admitting: *Deleted

## 2018-05-10 ENCOUNTER — Observation Stay
Admission: EM | Admit: 2018-05-10 | Discharge: 2018-05-11 | Disposition: A | Discharge status: Home / Self Care | Payer: Medicaid Other | Source: Home / Self Care | Admitting: Obstetrics and Gynecology

## 2018-05-10 DIAGNOSIS — O10913 Unspecified pre-existing hypertension complicating pregnancy, third trimester: Secondary | ICD-10-CM | POA: Diagnosis not present

## 2018-05-10 DIAGNOSIS — O99333 Smoking (tobacco) complicating pregnancy, third trimester: Secondary | ICD-10-CM

## 2018-05-10 DIAGNOSIS — F329 Major depressive disorder, single episode, unspecified: Secondary | ICD-10-CM

## 2018-05-10 DIAGNOSIS — Z8751 Personal history of pre-term labor: Secondary | ICD-10-CM

## 2018-05-10 DIAGNOSIS — Z6832 Body mass index (BMI) 32.0-32.9, adult: Secondary | ICD-10-CM

## 2018-05-10 DIAGNOSIS — O99213 Obesity complicating pregnancy, third trimester: Secondary | ICD-10-CM

## 2018-05-10 DIAGNOSIS — F32A Depression, unspecified: Secondary | ICD-10-CM

## 2018-05-10 DIAGNOSIS — O0990 Supervision of high risk pregnancy, unspecified, unspecified trimester: Secondary | ICD-10-CM

## 2018-05-10 DIAGNOSIS — O99340 Other mental disorders complicating pregnancy, unspecified trimester: Secondary | ICD-10-CM

## 2018-05-10 DIAGNOSIS — Z3A35 35 weeks gestation of pregnancy: Secondary | ICD-10-CM | POA: Insufficient documentation

## 2018-05-10 DIAGNOSIS — O4703 False labor before 37 completed weeks of gestation, third trimester: Secondary | ICD-10-CM

## 2018-05-10 DIAGNOSIS — Z3A32 32 weeks gestation of pregnancy: Secondary | ICD-10-CM | POA: Diagnosis not present

## 2018-05-10 DIAGNOSIS — O09213 Supervision of pregnancy with history of pre-term labor, third trimester: Secondary | ICD-10-CM | POA: Diagnosis not present

## 2018-05-10 DIAGNOSIS — O10919 Unspecified pre-existing hypertension complicating pregnancy, unspecified trimester: Secondary | ICD-10-CM

## 2018-05-10 MED ORDER — LACTATED RINGERS IV SOLN
INTRAVENOUS | Status: DC
  Administered 2018-05-10: 23:00:00 via INTRAVENOUS

## 2018-05-10 MED ORDER — PROMETHAZINE HCL 25 MG/ML IJ SOLN
25.0000 mg | Freq: Once | INTRAMUSCULAR | Status: AC
  Administered 2018-05-10: 25 mg via INTRAVENOUS
  Filled 2018-05-10: qty 1

## 2018-05-10 MED ORDER — LACTATED RINGERS IV BOLUS
1000.0000 mL | Freq: Once | INTRAVENOUS | Status: AC
  Administered 2018-05-10: 1000 mL via INTRAVENOUS

## 2018-05-10 MED ORDER — MORPHINE SULFATE (PF) 4 MG/ML IV SOLN
8.0000 mg | Freq: Once | INTRAVENOUS | Status: AC
  Administered 2018-05-10: 8 mg via INTRAVENOUS
  Filled 2018-05-10: qty 2

## 2018-05-10 NOTE — H&P (Signed)
OB History & Physical   History of Present Illness:  Chief Complaint: contractions  HPI:  Kimberly Haas is a 28 y.o. G69P0101 female at [redacted]w[redacted]d dated by LMP presenting to L&D with contractions today every 2-3 minutes. She has also noticed wetness in her underwear. She also has constant low back pain. She has a history of [redacted]w[redacted]d delivery and has been receiving Makena injections during this pregnancy. She had a positive fetal fibronectin at 32 weeks and received betamethasone x2 at that time.  Her pregnancy has been complicated by chronic hypertension and she is taking Metoprolol 25 mg XL.    She reports contractions.   She reports leakage of fluid.   She denies vaginal bleeding.  She reports fetal movement.    Maternal Medical History:   Past Medical History:  Diagnosis Date  . Acid reflux    occasional; TUMS as needed  . Depression    no current meds.  Merri Ray stones   . Lactating mother    instructed to pump and discard breast milk x 24 hours post-op    Past Surgical History:  Procedure Laterality Date  . CHOLECYSTECTOMY  03/05/2012   Procedure: LAPAROSCOPIC CHOLECYSTECTOMY WITH INTRAOPERATIVE CHOLANGIOGRAM;  Surgeon: Currie Paris, MD;  Location: [facility];  Service: General;  Laterality: N/A;  laparoscopic cholecystectomy with attempted interoperative cholangiogram    No Known Allergies  Prior to Admission medications   Medication Sig Start Date End Date Taking? Authorizing Provider  famotidine (PEPCID) 20 MG tablet Take 1 tablet (20 mg total) by mouth 2 (two) times daily. 04/24/18  Yes Farrel Conners, CNM  metoprolol tartrate (LOPRESSOR) 25 MG tablet Take 25 mg by mouth 1 day or 1 dose.   Yes [provider]  oseltamivir (TAMIFLU) 75 MG capsule Take 1 capsule (75 mg total) by mouth daily for 10 days. 05/08/18 05/18/18 Yes Farrel Conners, CNM  sertraline (ZOLOFT) 50 MG tablet Take 1 tablet by mouth daily. 07/09/17 07/09/18 Yes [provider]  acetaminophen (TYLENOL) 500 MG tablet Take 2 tablets (1,000 mg total) by mouth every 6 (six) hours as needed for moderate pain. Patient not taking: Reported on 04/27/2018 04/24/18   Farrel Conners, CNM  Doxylamine-Pyridoxine (DICLEGIS) 10-10 MG TBEC Take 2 tablets by mouth at bedtime. If symptoms persist, add one tablet in the morning and one in the afternoon Patient not taking: Reported on 05/04/2018 04/24/18   Oswaldo Conroy, CNM  hydroxyprogesterone caproate (MAKENA) 250 mg/mL OIL injection Inject 1 mL (250 mg total) into the muscle once for 1 dose. 12/03/17 04/23/18  Vena Austria, MD    OB History  Gravida Para Term Preterm AB Living  2 1   1   1   SAB TAB Ectopic Multiple Live Births          1    # Outcome Date GA Lbr Len/2nd Weight Sex Delivery Anes PTL Lv  2 Current           1 Preterm 01/13/12 [redacted]w[redacted]d / 00:28 2381 g F Vag-Spont EPI Y LIV    Prenatal care site: [facility]  Social History: She  reports that she quit smoking 11 days ago. Her smoking use included cigarettes. She has a 1.20 pack-year smoking history. She has never used smokeless tobacco. She reports that she does not drink alcohol or use drugs.  Family History: family history is not on file.    Review of Systems: Negative x 10 systems reviewed except as noted in the  HPI.    Physical Exam:  Vital Signs: BP 115/79 (BP Location: Left Arm)   Pulse 75   Temp 97.9 F (36.6 C) (Oral)   Resp 18   Ht 5\' 2"  (1.575 m)   Wt 83 kg   LMP 09/08/2017 (Exact Date)   BMI 33.47 kg/m  Constitutional: Well nourished, well developed female in no acute distress.  HEENT: normal Skin: Warm and dry.  Cardiovascular: Regular rate and rhythm.   Extremity: no edema  Respiratory: Clear to auscultation bilateral. Normal respiratory effort Abdomen: FHT present Back: no CVAT Neuro: DTRs 2+, Cranial nerves grossly intact Psych: Alert and Oriented x3. No memory deficits. Normal mood and affect.  MS: normal gait,  normal bilateral lower extremity ROM/strength/stability.  Pelvic exam: (female chaperone present) is not limited by body habitus EGBUS: within normal limits Vagina: within normal limits and with normal mucosa  Cervix: 2.5/60-70/-1 to -2 on arrival to L&D and no change after 2 hours. Sterile speculum exam negative for pooling, negative for ferning  Fetal well being: 135 bpm, moderate variability, +accelerations, -decelerations Toco: every 1-3 minutes lasting 4 to 60 seconds  Offered IV fluids and morphine rest to patient versus going home and taking tylenol PM. Patient opts for staying in triage for fluids and morphine rest.   Pertinent Results:  Prenatal Labs: Blood type/Rh O positive  Antibody screen negative  Rubella Not immune  Varicella Immune    RPR Non-reactive  HBsAg negative  HIV negative  GC negative  Chlamydia negative  Genetic screening Negative NIPT, Inheritest  1 hour GTT 146  3 hour GTT All WNL  GBS negative on 12/30     Assessment:  Kimberly Haas is a 28 y.o. 782P0101 female at 5874w6d with preterm prodromal contractions.   Plan:  1. Admit for observation 2. IV fluids 3. Morphine IV/Phenergan IV for therapeutic rest  4. Fetal well-being: Category I   Tresea MallJane Daziyah Cogan, CNM 05/10/2018 10:09 PM

## 2018-05-10 NOTE — OB Triage Note (Signed)
Patient to OBS 3 with complaint of 9/10 pain with contractions.  1/10 in between ctx.  Pain felt in pelvis, back and abdomen.  She reports nausea since Saturday. She reports contractions as close as 2 minutes apart.

## 2018-05-11 MED ORDER — TERBUTALINE SULFATE 1 MG/ML IJ SOLN
0.2500 mg | Freq: Once | INTRAMUSCULAR | Status: AC
  Administered 2018-05-11: 0.25 mg via SUBCUTANEOUS
  Filled 2018-05-11: qty 1

## 2018-05-11 NOTE — Final Progress Note (Signed)
Physician Final Progress Note  Patient ID: Kimberly Haas MRN: [id number] DOB/AGE: 07-10-1990 28 y.o.  Admit date: 05/10/2018 Admitting provider: Conard Novak, MD Discharge date: 05/11/2018   Admission Diagnoses: Preterm contractions  Discharge Diagnoses:  Preterm contractions  History of Present Illness: The patient is a 28 y.o. female G2P0101 at [redacted]w[redacted]d who presented for contactions yesterday evening. She received fluids and medications for therapeutic rest. She continues to contract irregularly, but has not made any cervical change. See H&P for additional details. At the time of discharge, she is not leaking fluid, has no vaginal bleeding, and fetal movement is good.  Review of Systems: Review of systems negative unless otherwise noted in HPI.   Past Medical History:  Diagnosis Date  . Acid reflux    occasional; TUMS as needed  . Depression    no current meds.  Merri Ray stones   . Lactating mother    instructed to pump and discard breast milk x 24 hours post-op    Past Surgical History:  Procedure Laterality Date  . CHOLECYSTECTOMY  03/05/2012   Procedure: LAPAROSCOPIC CHOLECYSTECTOMY WITH INTRAOPERATIVE CHOLANGIOGRAM;  Surgeon: Currie Paris, MD;  Location: [facility];  Service: General;  Laterality: N/A;  laparoscopic cholecystectomy with attempted interoperative cholangiogram    No current facility-administered medications on file prior to encounter.    Current Outpatient Medications on File Prior to Encounter  Medication Sig Dispense Refill  . famotidine (PEPCID) 20 MG tablet Take 1 tablet (20 mg total) by mouth 2 (two) times daily. 60 tablet 1  . metoprolol tartrate (LOPRESSOR) 25 MG tablet Take 25 mg by mouth 1 day or 1 dose.    . oseltamivir (TAMIFLU) 75 MG capsule Take 1 capsule (75 mg total) by mouth daily for 10 days. 10 capsule 0  . sertraline (ZOLOFT) 50 MG tablet Take 1 tablet by mouth daily.    Marland Kitchen acetaminophen (TYLENOL) 500 MG tablet Take  2 tablets (1,000 mg total) by mouth every 6 (six) hours as needed for moderate pain. (Patient not taking: Reported on 04/27/2018) 30 tablet 0  . Doxylamine-Pyridoxine (DICLEGIS) 10-10 MG TBEC Take 2 tablets by mouth at bedtime. If symptoms persist, add one tablet in the morning and one in the afternoon (Patient not taking: Reported on 05/04/2018) 100 tablet 1  . hydroxyprogesterone caproate (MAKENA) 250 mg/mL OIL injection Inject 1 mL (250 mg total) into the muscle once for 1 dose. 5 mL 3    No Known Allergies  Social History   Socioeconomic History  . Marital status: Single    Spouse name: Not on file  . Number of children: Not on file  . Years of education: Not on file  . Highest education level: Not on file  Occupational History  . Occupation: CSR    Employer: OTHER    Comment: Pensions consultant  Social Needs  . Financial resource strain: Not on file  . Food insecurity:    Worry: Not on file    Inability: Not on file  . Transportation needs:    Medical: Not on file    Non-medical: Not on file  Tobacco Use  . Smoking status: Former Smoker    Packs/day: 0.15    Years: 8.00    Pack years: 1.20    Types: Cigarettes    Last attempt to quit: 04/29/2018    Years since quitting: 0.0  . Smokeless tobacco: Never Used  Substance and Sexual Activity  . Alcohol use: No    Comment:  h/o EtOH use, but not now  . Drug use: No  . Sexual activity: Yes    Birth control/protection: None  Lifestyle  . Physical activity:    Days per week: Not on file    Minutes per session: Not on file  . Stress: Not on file  Relationships  . Social connections:    Talks on phone: Not on file    Gets together: Not on file    Attends religious service: Not on file    Active member of club or organization: Not on file    Attends meetings of clubs or organizations: Not on file    Relationship status: Not on file  . Intimate partner violence:    Fear of current or ex partner: Not on file    Emotionally  abused: Not on file    Physically abused: Not on file    Forced sexual activity: Not on file  Other Topics Concern  . Not on file  Social History Narrative  . Not on file    Family history: Negative/unremarkable except as detailed in HPI. No family history of birth defects.   Physical Exam: BP 120/80 (BP Location: Right Arm)   Pulse 84   Temp 98.2 F (36.8 C) (Oral)   Resp 16   Ht 5\' 2"  (1.575 m)   Wt 83 kg   LMP 09/08/2017 (Exact Date)   BMI 33.47 kg/m   Gen: NAD CV: Regular rate Pulm: No increased work of breathing Pelvic: 2.5/70/-2 Ext: No signs of DVT  Consults: None  Significant Findings/ Diagnostic Studies: SSE last night was negative for pooling and ferning.  Procedures: Reactive NST  Discharge Condition: stable  Disposition: Discharge disposition: 01-Home or Self Care       Diet: Regular diet  Discharge Activity: Activity as tolerated  Discharge Instructions    Discharge activity:  No Restrictions   Complete by:  As directed    Discharge diet:  No restrictions   Complete by:  As directed    Fetal Kick Count:  Lie on our left side for one hour after a meal, and count the number of times your baby kicks.  If it is less than 5 times, get up, move around and drink some juice.  Repeat the test 30 minutes later.  If it is still less than 5 kicks in an hour, notify your doctor.   Complete by:  As directed    LABOR:  When conractions begin, you should start to time them from the beginning of one contraction to the beginning  of the next.  When contractions are 5 - 10 minutes apart or less and have been regular for at least an hour, you should call your health care provider.   Complete by:  As directed    No sexual activity restrictions   Complete by:  As directed    Notify physician for bleeding from the vagina   Complete by:  As directed    Notify physician for blurring of vision or spots before the eyes   Complete by:  As directed    Notify physician  for chills or fever   Complete by:  As directed    Notify physician for fainting spells, "black outs" or loss of consciousness   Complete by:  As directed    Notify physician for increase in vaginal discharge   Complete by:  As directed    Notify physician for leaking of fluid   Complete by:  As directed  Notify physician for pain or burning when urinating   Complete by:  As directed    Notify physician for pelvic pressure (sudden increase)   Complete by:  As directed    Notify physician for severe or continued nausea or vomiting   Complete by:  As directed    Notify physician for sudden gushing of fluid from the vagina (with or without continued leaking)   Complete by:  As directed    Notify physician for sudden, constant, or occasional abdominal pain   Complete by:  As directed    Notify physician if baby moving less than usual   Complete by:  As directed      Allergies as of 05/11/2018   No Known Allergies     Medication List    TAKE these medications   acetaminophen 500 MG tablet Commonly known as:  TYLENOL Take 2 tablets (1,000 mg total) by mouth every 6 (six) hours as needed for moderate pain.   Doxylamine-Pyridoxine 10-10 MG Tbec Commonly known as:  DICLEGIS Take 2 tablets by mouth at bedtime. If symptoms persist, add one tablet in the morning and one in the afternoon   famotidine 20 MG tablet Commonly known as:  PEPCID Take 1 tablet (20 mg total) by mouth 2 (two) times daily.   hydroxyprogesterone caproate 250 mg/mL Oil injection Commonly known as:  MAKENA Inject 1 mL (250 mg total) into the muscle once for 1 dose.   metoprolol tartrate 25 MG tablet Commonly known as:  LOPRESSOR Take 25 mg by mouth 1 day or 1 dose.   oseltamivir 75 MG capsule Commonly known as:  TAMIFLU Take 1 capsule (75 mg total) by mouth daily for 10 days.   ZOLOFT 50 MG tablet Generic drug:  sertraline Take 1 tablet by mouth daily.      Keep scheduled ROB  appointment.  Signed: Oswaldo ConroyJacelyn Y , CNM  05/11/2018

## 2018-05-11 NOTE — Discharge Summary (Signed)
RN reviewed discharge instructions and follow-up care with the patient. Gave patient opportunity for questions. All questions answered at this time. Patient verbalized understanding. Pt discharged home with her mother.

## 2018-05-12 ENCOUNTER — Inpatient Hospital Stay
Admission: EM | Admit: 2018-05-12 | Discharge: 2018-05-15 | DRG: 806 | Disposition: A | Discharge status: Home / Self Care | Payer: Medicaid Other | Attending: Obstetrics and Gynecology | Admitting: Obstetrics and Gynecology

## 2018-05-12 ENCOUNTER — Ambulatory Visit (INDEPENDENT_AMBULATORY_CARE_PROVIDER_SITE_OTHER): Payer: Medicaid Other

## 2018-05-12 ENCOUNTER — Encounter: Payer: Self-pay | Admitting: Advanced Practice Midwife

## 2018-05-12 ENCOUNTER — Ambulatory Visit (INDEPENDENT_AMBULATORY_CARE_PROVIDER_SITE_OTHER): Payer: Medicaid Other | Admitting: Advanced Practice Midwife

## 2018-05-12 VITALS — BP 130/80 | Wt 188.0 lb

## 2018-05-12 DIAGNOSIS — Z3A35 35 weeks gestation of pregnancy: Secondary | ICD-10-CM | POA: Diagnosis not present

## 2018-05-12 DIAGNOSIS — Z87891 Personal history of nicotine dependence: Secondary | ICD-10-CM

## 2018-05-12 DIAGNOSIS — O1002 Pre-existing essential hypertension complicating childbirth: Secondary | ICD-10-CM | POA: Diagnosis present

## 2018-05-12 DIAGNOSIS — Z8751 Personal history of pre-term labor: Secondary | ICD-10-CM

## 2018-05-12 DIAGNOSIS — Z6832 Body mass index (BMI) 32.0-32.9, adult: Secondary | ICD-10-CM

## 2018-05-12 DIAGNOSIS — O42913 Preterm premature rupture of membranes, unspecified as to length of time between rupture and onset of labor, third trimester: Secondary | ICD-10-CM | POA: Diagnosis present

## 2018-05-12 DIAGNOSIS — O43123 Velamentous insertion of umbilical cord, third trimester: Secondary | ICD-10-CM | POA: Diagnosis present

## 2018-05-12 DIAGNOSIS — O99333 Smoking (tobacco) complicating pregnancy, third trimester: Secondary | ICD-10-CM

## 2018-05-12 DIAGNOSIS — O9081 Anemia of the puerperium: Secondary | ICD-10-CM | POA: Diagnosis not present

## 2018-05-12 DIAGNOSIS — F17210 Nicotine dependence, cigarettes, uncomplicated: Secondary | ICD-10-CM

## 2018-05-12 DIAGNOSIS — K219 Gastro-esophageal reflux disease without esophagitis: Secondary | ICD-10-CM | POA: Diagnosis present

## 2018-05-12 DIAGNOSIS — O26893 Other specified pregnancy related conditions, third trimester: Secondary | ICD-10-CM | POA: Diagnosis present

## 2018-05-12 DIAGNOSIS — F32A Depression, unspecified: Secondary | ICD-10-CM

## 2018-05-12 DIAGNOSIS — O10913 Unspecified pre-existing hypertension complicating pregnancy, third trimester: Secondary | ICD-10-CM

## 2018-05-12 DIAGNOSIS — O09213 Supervision of pregnancy with history of pre-term labor, third trimester: Secondary | ICD-10-CM

## 2018-05-12 DIAGNOSIS — F329 Major depressive disorder, single episode, unspecified: Secondary | ICD-10-CM

## 2018-05-12 DIAGNOSIS — E669 Obesity, unspecified: Secondary | ICD-10-CM | POA: Diagnosis present

## 2018-05-12 DIAGNOSIS — O99214 Obesity complicating childbirth: Secondary | ICD-10-CM | POA: Diagnosis present

## 2018-05-12 DIAGNOSIS — D62 Acute posthemorrhagic anemia: Secondary | ICD-10-CM | POA: Diagnosis not present

## 2018-05-12 DIAGNOSIS — O0990 Supervision of high risk pregnancy, unspecified, unspecified trimester: Secondary | ICD-10-CM

## 2018-05-12 DIAGNOSIS — O42919 Preterm premature rupture of membranes, unspecified as to length of time between rupture and onset of labor, unspecified trimester: Secondary | ICD-10-CM | POA: Diagnosis present

## 2018-05-12 DIAGNOSIS — O99340 Other mental disorders complicating pregnancy, unspecified trimester: Secondary | ICD-10-CM

## 2018-05-12 DIAGNOSIS — O9962 Diseases of the digestive system complicating childbirth: Secondary | ICD-10-CM | POA: Diagnosis present

## 2018-05-12 DIAGNOSIS — O10919 Unspecified pre-existing hypertension complicating pregnancy, unspecified trimester: Secondary | ICD-10-CM

## 2018-05-12 DIAGNOSIS — O1092 Unspecified pre-existing hypertension complicating childbirth: Secondary | ICD-10-CM | POA: Diagnosis not present

## 2018-05-12 LAB — FETAL NONSTRESS TEST

## 2018-05-12 LAB — COMPREHENSIVE METABOLIC PANEL
ALT: 15 U/L (ref 0–44)
AST: 22 U/L (ref 15–41)
Albumin: 2.6 g/dL — ABNORMAL LOW (ref 3.5–5.0)
Alkaline Phosphatase: 149 U/L — ABNORMAL HIGH (ref 38–126)
Anion gap: 8 (ref 5–15)
BUN: 5 mg/dL — ABNORMAL LOW (ref 6–20)
CHLORIDE: 106 mmol/L (ref 98–111)
CO2: 21 mmol/L — ABNORMAL LOW (ref 22–32)
Calcium: 8.4 mg/dL — ABNORMAL LOW (ref 8.9–10.3)
Creatinine, Ser: 0.38 mg/dL — ABNORMAL LOW (ref 0.44–1.00)
GFR calc Af Amer: >60 mL/min (ref >60)
GFR calc non Af Amer: >60 mL/min (ref >60)
Glucose, Bld: 70 mg/dL (ref 70–99)
Potassium: 3.1 mmol/L — ABNORMAL LOW (ref 3.5–5.1)
SODIUM: 135 mmol/L (ref 135–145)
Total Bilirubin: 0.4 mg/dL (ref 0.3–1.2)
Total Protein: 6.3 g/dL — ABNORMAL LOW (ref 6.5–8.1)

## 2018-05-12 LAB — CBC
HEMATOCRIT: 33.1 % — AB (ref 36.0–46.0)
Hemoglobin: 11.6 g/dL — ABNORMAL LOW (ref 12.0–15.0)
MCH: 32.5 pg (ref 26.0–34.0)
MCHC: 35 g/dL (ref 30.0–36.0)
MCV: 92.7 fL (ref 80.0–100.0)
Platelets: 251 10*3/uL (ref 150–400)
RBC: 3.57 MIL/uL — ABNORMAL LOW (ref 3.87–5.11)
RDW: 13.7 % (ref 11.5–15.5)
WBC: 14.9 10*3/uL — ABNORMAL HIGH (ref 4.0–10.5)
nRBC: 0 % (ref 0.0–0.2)

## 2018-05-12 LAB — URINE DRUG SCREEN, QUALITATIVE (ARMC ONLY)
Amphetamines, Ur Screen: NOT DETECTED
Barbiturates, Ur Screen: NOT DETECTED
Benzodiazepine, Ur Scrn: NOT DETECTED
COCAINE METABOLITE, UR ~~LOC~~: NOT DETECTED
Cannabinoid 50 Ng, Ur ~~LOC~~: NOT DETECTED
MDMA (Ecstasy)Ur Screen: NOT DETECTED
Methadone Scn, Ur: NOT DETECTED
Opiate, Ur Screen: NOT DETECTED
Phencyclidine (PCP) Ur S: NOT DETECTED
Tricyclic, Ur Screen: NOT DETECTED

## 2018-05-12 LAB — RUPTURE OF MEMBRANE (ROM)PLUS: Rom Plus: POSITIVE

## 2018-05-12 LAB — OB RESULTS CONSOLE GBS: GBS: NEGATIVE

## 2018-05-12 LAB — PROTEIN / CREATININE RATIO, URINE
Creatinine, Urine: 80 mg/dL
PROTEIN CREATININE RATIO: 0.14 mg/mg{creat} (ref 0.00–0.15)
Total Protein, Urine: 11 mg/dL

## 2018-05-12 MED ORDER — OXYTOCIN 10 UNIT/ML IJ SOLN
INTRAMUSCULAR | Status: AC
  Filled 2018-05-12: qty 2

## 2018-05-12 MED ORDER — OXYTOCIN 40 UNITS IN NORMAL SALINE INFUSION - SIMPLE MED
2.5000 [IU]/h | INTRAVENOUS | Status: DC
  Administered 2018-05-13 (×2): 2.5 [IU]/h via INTRAVENOUS
  Filled 2018-05-12: qty 1000

## 2018-05-12 MED ORDER — TERBUTALINE SULFATE 1 MG/ML IJ SOLN: 0.2500 mg | Freq: Once | INTRAMUSCULAR | Status: DC | PRN

## 2018-05-12 MED ORDER — ACETAMINOPHEN 325 MG PO TABS
650.0000 mg | ORAL_TABLET | ORAL | Status: DC | PRN
  Administered 2018-05-13 – 2018-05-14 (×4): 650 mg via ORAL
  Filled 2018-05-12 (×4): qty 2

## 2018-05-12 MED ORDER — LIDOCAINE HCL (PF) 1 % IJ SOLN
30.0000 mL | INTRAMUSCULAR | Status: DC | PRN
  Filled 2018-05-12: qty 30

## 2018-05-12 MED ORDER — ONDANSETRON HCL 4 MG/2ML IJ SOLN: 4.0000 mg | Freq: Four times a day (QID) | INTRAMUSCULAR | Status: DC | PRN

## 2018-05-12 MED ORDER — SODIUM CHLORIDE 0.9 % IV SOLN
5.0000 10*6.[IU] | Freq: Once | INTRAVENOUS | Status: AC
  Administered 2018-05-12: 5 10*6.[IU] via INTRAVENOUS
  Filled 2018-05-12: qty 5

## 2018-05-12 MED ORDER — METOPROLOL TARTRATE 25 MG PO TABS
25.0000 mg | ORAL_TABLET | Freq: Every day | ORAL | Status: DC
  Administered 2018-05-13 – 2018-05-15 (×3): 25 mg via ORAL
  Filled 2018-05-12: qty 1
  Filled 2018-05-12: qty 0.5
  Filled 2018-05-12 (×3): qty 1

## 2018-05-12 MED ORDER — OXYTOCIN BOLUS FROM INFUSION
500.0000 mL | Freq: Once | INTRAVENOUS | Status: AC
  Administered 2018-05-13: 500 mL via INTRAVENOUS

## 2018-05-12 MED ORDER — LACTATED RINGERS IV SOLN: 500.0000 mL | INTRAVENOUS | Status: DC | PRN

## 2018-05-12 MED ORDER — SOD CITRATE-CITRIC ACID 500-334 MG/5ML PO SOLN: 30.0000 mL | ORAL | Status: DC | PRN

## 2018-05-12 MED ORDER — AMMONIA AROMATIC IN INHA
RESPIRATORY_TRACT | Status: AC
  Filled 2018-05-12: qty 10

## 2018-05-12 MED ORDER — MISOPROSTOL 200 MCG PO TABS
ORAL_TABLET | ORAL | Status: AC
  Administered 2018-05-13: 800 ug
  Filled 2018-05-12: qty 4

## 2018-05-12 MED ORDER — LACTATED RINGERS IV SOLN
INTRAVENOUS | Status: DC
  Administered 2018-05-12 – 2018-05-13 (×2): via INTRAVENOUS

## 2018-05-12 MED ORDER — BUTORPHANOL TARTRATE 1 MG/ML IJ SOLN: 1.0000 mg | INTRAMUSCULAR | Status: DC | PRN

## 2018-05-12 MED ORDER — OXYTOCIN 40 UNITS IN NORMAL SALINE INFUSION - SIMPLE MED
1.0000 m[IU]/min | INTRAVENOUS | Status: DC
  Administered 2018-05-13: 2 m[IU]/min via INTRAVENOUS
  Filled 2018-05-12: qty 1000

## 2018-05-12 MED ORDER — PENICILLIN G 3 MILLION UNITS IVPB - SIMPLE MED
3.0000 10*6.[IU] | INTRAVENOUS | Status: DC
  Filled 2018-05-12 (×3): qty 100

## 2018-05-12 NOTE — H&P (Signed)
Obstetric H&P   Chief Complaint: Leaking Fluid  Prenatal Care Provider: Contractions  History of Present Illness: 28 y.o. G2P0101 [redacted]w[redacted]d by 06/15/2018, by Last Menstrual Period presenting to L&D with contractions and clear loss of fluid.  Gross rupture on presentation.  No vaginal bleeding, positive fetal movement.  Pregnancy complicated by history of CHTN (metorprolol) preterm delivery at 36 weeks (managed with 17-P injections this pregnancy), received BMZ 04/23/18 for preterm contractions.  Pregravid weight 81.6 kg Total Weight Gain 3.629 kg  Pregnancy #2 Problems (from 10/23/17 to present)    Problem Noted Resolved   Obesity affecting pregnancy 04/08/2018 by Conard Novak, MD No   BMI 32.0-32.9,adult 04/08/2018 by Conard Novak, MD No   History of preterm delivery 12/03/2017 by Vena Austria, MD No   Overview Addendum 12/31/2017  9:55 AM by Vena Austria, MD    Makena candidate 35 week PPROM [X]  Baseline cervical length 16 weeks - 3.82cm      Tobacco use during pregnancy 11/05/2017 by Oswaldo Conroy, CNM No   Chronic hypertension affecting pregnancy 10/23/2017 by Tresea Mall, CNM No   Overview Addendum 12/03/2017 10:18 AM by Vena Austria, MD    [X]  Aspirin 81 mg daily after 12 weeks; discontinue after 36 weeks [ ]  baseline labs with CBC, CMP, urine protein/creatinine ratio [ ]  ultrasound for growth at 28, 32, 36 weeks   Current antihypertensives:  metoprolol 25mg    Baseline and surveillance labs (pulled in from [facility], refresh links as needed)  Lab Results  Component Value Date   PLT 361 11/05/2017   CREATININE 0.61 03/02/2012   AST 40 (H) 03/02/2012   ALT 54 (H) 03/02/2012    Antenatal Testing CHTN - O10.919  Group I  BP < 140/90, no preeclampsia, AGA,  nml AFV, +/- meds    Group II BP > 140/90, on meds, no preeclampsia, AGA, nml AFV  20-28-34-38  20-24-28-32-35-38  32//2 x wk  28//BPP wkly then 32//2 x wk  40 no meds; 39 meds  PRN or 37    Pre-eclampsia  GHTN - O13.9/Preeclampsia without severe features  - O14.00   Preeclampsia with severe features - O14.10  Q 3-4wks  Q 2 wks  28//BPP wkly then 32//2 x wk  Inpatient  37  PRN or 34         Depression affecting pregnancy, antepartum 10/23/2017 by Tresea Mall, CNM No   Supervision of high risk pregnancy, antepartum 10/23/2017 by Tresea Mall, CNM No   Overview Addendum 05/12/2018 10:50 PM by Vena Austria, MD    [facility]inic [facility] Prenatal Labs  Dating L=8 Blood type: O/Positive/-- (07/10 0948)   Genetic Screen NIPT: Normal XX Inheritest: negative SMA, CF, and Fragile X Antibody:Negative (07/10 0948)  Anatomic [place] Normal 01/28/18 Rubella: <0.90 (07/10 0948) Varicella: Immune  GTT 146; 3 hr WNL: 79/103/126/134 RPR: Non Reactive (07/10 0948)   Rhogam N/A HBsAg: Negative (07/10 0948)   TDaP vaccine           04/08/18            Flu Shot:01/28/18 HIV: Non Reactive (07/10 0948)   Baby Food   Breast                             GBS: Negative 04/27/2018  Contraception  Nexplanon Pap: 09/11/2017, ASCUS/ HPV Negative  Support Person Boyfriend [name] Pelvis tested to 5lbs 4oz  Review of Systems: 10 point review of systems negative unless otherwise noted in HPI  Past Medical History: Past Medical History:  Diagnosis Date  . Acid reflux    occasional; TUMS as needed  . Depression    no current meds.  [place]. Gall stones   . Lactating mother    instructed to pump and discard breast milk x 24 hours post-op    Past Surgical History: Past Surgical History:  Procedure Laterality Date  . CHOLECYSTECTOMY  03/05/2012   Procedure: LAPAROSCOPIC CHOLECYSTECTOMY WITH INTRAOPERATIVE CHOLANGIOGRAM;  Surgeon: Currie Parishristian J Streck, MD;  Location: [facility];  Service: General;  Laterality: N/A;  laparoscopic cholecystectomy with attempted interoperative cholangiogram    Past Obstetric History: #: 1, Date: 01/13/12, Sex: Female, Weight: 2381 g, GA: 5445w4d,  Delivery: Vaginal, Spontaneous, Apgar1: 8, Apgar5: 9, Living: Living, Birth Comments: None  #: 2, Date: None, Sex: None, Weight: None, GA: None, Delivery: None, Apgar1: None, Apgar5: None, Living: None, Birth Comments: None   Past Gynecologic History:  Family History: No family history on file.  Social History: Social History   Socioeconomic History  . Marital status: Single    Spouse name: Not on file  . Number of children: Not on file  . Years of education: Not on file  . Highest education level: Not on file  Occupational History  . Occupation: CSR    Employer: OTHER    Comment: [employer]  Social Needs  . Financial resource strain: Not on file  . Food insecurity:    Worry: Not on file    Inability: Not on file  . Transportation needs:    Medical: Not on file    Non-medical: Not on file  Tobacco Use  . Smoking status: Former Smoker    Packs/day: 0.15    Years: 8.00    Pack years: 1.20    Types: Cigarettes    Last attempt to quit: 04/29/2018    Years since quitting: 0.0  . Smokeless tobacco: Never Used  Substance and Sexual Activity  . Alcohol use: No    Comment: h/o EtOH use, but not now  . Drug use: No  . Sexual activity: Yes    Birth control/protection: None  Lifestyle  . Physical activity:    Days per week: Not on file    Minutes per session: Not on file  . Stress: Not on file  Relationships  . Social connections:    Talks on phone: Not on file    Gets together: Not on file    Attends religious service: Not on file    Active member of club or organization: Not on file    Attends meetings of clubs or organizations: Not on file    Relationship status: Not on file  . Intimate partner violence:    Fear of current or ex partner: Not on file    Emotionally abused: Not on file    Physically abused: Not on file    Forced sexual activity: Not on file  Other Topics Concern  . Not on file  Social History Narrative  . Not on file     Medications: Prior to Admission medications   Medication Sig Start Date End Date Taking? Authorizing Provider  famotidine (PEPCID) 20 MG tablet Take 1 tablet (20 mg total) by mouth 2 (two) times daily. 04/24/18  Yes Farrel ConnersGutierrez, Colleen, CNM  metoprolol tartrate (LOPRESSOR) 25 MG tablet Take 25 mg by mouth 1 day or 1 dose.   Yes [provider]  sertraline (ZOLOFT) 50 MG tablet Take 1 tablet by mouth daily. 07/09/17 07/09/18 Yes [provider]  acetaminophen (TYLENOL) 500 MG tablet Take 2 tablets (1,000 mg total) by mouth every 6 (six) hours as needed for moderate pain. Patient not taking: Reported on 04/27/2018 04/24/18   Farrel Conners, CNM  Doxylamine-Pyridoxine (DICLEGIS) 10-10 MG TBEC Take 2 tablets by mouth at bedtime. If symptoms persist, add one tablet in the morning and one in the afternoon Patient not taking: Reported on 05/04/2018 04/24/18   Oswaldo Conroy, CNM  hydroxyprogesterone caproate (MAKENA) 250 mg/mL OIL injection Inject 1 mL (250 mg total) into the muscle once for 1 dose. 12/03/17 04/23/18  Vena Austria, MD  oseltamivir (TAMIFLU) 75 MG capsule Take 1 capsule (75 mg total) by mouth daily for 10 days. 05/08/18 05/18/18  Farrel Conners, CNM    Allergies: No Known Allergies  Physical Exam: Vitals: Blood pressure (!) 134/98, pulse 98, temperature 98 F (36.7 C), temperature source Oral, resp. rate 18, last menstrual period 09/08/2017.  Urine Dip Protein: P/C ratio pending  FHT: 140, moderate, +accels, no decels Toco: q1-11min  General: NAD HEENT: normocephalic, anicteric Pulmonary: No increased work of breathing Cardiovascular: RRR, distal pulses 2+ Abdomen: Gravid, non-tender Leopolds: vtx, 5lbs Genitourinary:  Dilation: 3 Effacement (%): 60 Cervical Position: Posterior Station: -2 Exam by:: Kathleen Argue, RN   Extremities: no edema, erythema, or tenderness Neurologic: Grossly intact Psychiatric: mood appropriate, affect  full  Labs: No results found for this or any previous visit (from the past 24 hour(s)).  Assessment: 28 y.o. G2P0101 [redacted]w[redacted]d by 06/15/2018, by Last Menstrual Period presenting in with prelabor rupture of membranes  Plan: 1) Labor - expectant management if contraction space out or fails to make cervical change   2) Fetus - cat I tracing - BMZ 04/26/18 - GBS negative 04/26/18 will send GBS PCR, PCN until results in  3) PNL - Blood type --/--/O POS (01/10 1458) / Anti-bodyscreen NEG (01/10 1458) / Rubella <0.90 (07/10 0948) / Varicella Immune / RPR Non Reactive (01/10 1500) / HBsAg Negative (07/10 0948) / HIV Non Reactive (11/27 0934) / 1-hr OGTT 146; 3 hr WNL: 79/103/126/134 / GBS  egative  4) Immunization History -  Immunization History  Administered Date(s) Administered  . Influenza Split 01/15/2012  . Influenza,inj,Quad PF,6+ Mos 01/28/2018  . Tdap 01/14/2012, 04/08/2018    5) Disposition -   Vena Austria, MD, [facility], [facility] 05/12/2018, 10:52 PM

## 2018-05-12 NOTE — Progress Notes (Signed)
Routine Prenatal Care Visit  Subjective  Kimberly Haas is a 28 y.o. G2P0101 at 463w1d being seen today for ongoing prenatal care.  She is currently monitored for the following issues for this high-risk pregnancy and has Chronic hypertension affecting pregnancy; Depression affecting pregnancy, antepartum; Supervision of high risk pregnancy, antepartum; Tobacco use during pregnancy; History of preterm delivery; Obesity affecting pregnancy; BMI 32.0-32.9,adult; Preterm uterine contractions; Positive fetal fibronectin at 22 weeks to [redacted] weeks gestation; Threatened preterm labor; Threatened preterm labor, third trimester; and Indication for care in labor and delivery, antepartum on their problem list.  ----------------------------------------------------------------------------------- Patient reports continuing frequent crampy contractions. She feels them every 3-4 minutes and some are stronger than others. She is able to talk through them. She did not sleep much last night. Discussed taking epsom salt bath, taking OTC magnesium supplement, staying well hydrated, taking tylenol PM to help with sleep.  She declined 17P injection today.  Contractions: Irregular. Vag. Bleeding: None.  Movement: Present. Denies leaking of fluid.  ----------------------------------------------------------------------------------- The following portions of the patient's history were reviewed and updated as appropriate: allergies, current medications, past family history, past medical history, past social history, past surgical history and problem list. Problem list updated.   Objective  Blood pressure 130/80, weight 188 lb (85.3 kg), last menstrual period 09/08/2017. Pregravid weight 180 lb (81.6 kg) Total Weight Gain 8 lb (3.629 kg) Urinalysis: Urine Protein    Urine Glucose    Fetal Status: Fetal Heart Rate (bpm): 145   Movement: Present  Presentation: Vertex  AFI: 18.9 NST: reactive 20 minute tracing. 145 bpm baseline,  moderate variability, +accelerations to 170, -decelerations  General:  Alert, oriented and cooperative. Patient is in no acute distress.  Skin: Skin is warm and dry. No rash noted.   Cardiovascular: Normal heart rate noted  Respiratory: Normal respiratory effort, no problems with respiration noted  Abdomen: Soft, gravid, appropriate for gestational age. Pain/Pressure: Present     Pelvic:  Cervical exam deferred        Extremities: Normal range of motion.  Edema: None  Mental Status: Normal mood and affect. Normal behavior. Normal judgment and thought content.   Assessment   28 y.o. G2P0101 at 7763w1d by  06/15/2018, by Last Menstrual Period presenting for routine prenatal visit  Plan   Pregnancy #2 Problems (from 10/23/17 to present)    Problem Noted Resolved   Obesity affecting pregnancy 04/08/2018 by Conard NovakJackson, Stephen D, MD No   BMI 32.0-32.9,adult 04/08/2018 by Conard NovakJackson, Stephen D, MD No   History of preterm delivery 12/03/2017 by Vena AustriaStaebler, Andreas, MD No   Overview Addendum 12/31/2017  9:55 AM by Vena AustriaStaebler, Andreas, MD    Makena candidate 35 week PPROM [X]  Baseline cervical length 16 weeks - 3.82cm      Tobacco use during pregnancy 11/05/2017 by Oswaldo ConroySchmid, Jacelyn Y, CNM No   Chronic hypertension affecting pregnancy 10/23/2017 by Tresea MallGledhill, Liandro Thelin, CNM No   Overview Addendum 12/03/2017 10:18 AM by Vena AustriaStaebler, Andreas, MD    [X]  Aspirin 81 mg daily after 12 weeks; discontinue after 36 weeks [ ]  baseline labs with CBC, CMP, urine protein/creatinine ratio [ ]  ultrasound for growth at 28, 32, 36 weeks   Current antihypertensives:  metoprolol 25mg    [facility] and surveillance [facility] refresh links as needed)  Lab Results  Component Value Date   PLT 361 11/05/2017   CREATININE 0.61 03/02/2012   AST 40 (H) 03/02/2012   ALT 54 (H) 03/02/2012    Antenatal Testing CHTN -  O10.919  Group I  BP < 140/90, no preeclampsia, AGA,  nml AFV, +/- meds    Group II BP > 140/90, on meds,  no preeclampsia, AGA, nml AFV  20-28-34-38  20-24-28-32-35-38  32//2 x wk  28//BPP wkly then 32//2 x wk  40 no meds; 39 meds  PRN or 37  Pre-eclampsia  GHTN - O13.9/Preeclampsia without severe features  - O14.00   Preeclampsia with severe features - O14.10  Q 3-4wks  Q 2 wks  28//BPP wkly then 32//2 x wk  Inpatient  37  PRN or 34         Depression affecting pregnancy, antepartum 10/23/2017 by Tresea MallGledhill, Kyley Solow, CNM No   Supervision of high risk pregnancy, antepartum 10/23/2017 by Tresea MallGledhill, Mutasim Tuckey, CNM No   Overview Addendum 05/04/2018  3:26 PM by Natale MilchSchuman, Christanna R, MD    [facility] Prenatal Labs  Dating L=8 Blood type: O/Positive/-- (07/10 0948)   Genetic Screen NIPT: Normal XX Inheritest: negative SMA, CF, and Fragile X Antibody:Negative (07/10 0948)  Anatomic US Normal 01/28/18 Rubella: <0.90 (07/10 0948) Varicella: Immune  GTT 146; 3 hr WNL: 79/103/126/134 RPR: Non Reactive (07/10 0948)   Rhogam N/A HBsAg: Negative (07/10 0948)   TDaP vaccine           04/08/18            Flu Shot:01/28/18 HIV: Non Reactive (07/10 0948)   Baby Food   Breast                             GBS:   Contraception  Nexplanon Pap: 09/11/2017, ASCUS/ HPV Negative  CBB     CS/VBAC    Support Person Boyfriend [name]               Preterm labor symptoms and general obstetric precautions including but not limited to vaginal bleeding, contractions, leaking of fluid and fetal movement were reviewed in detail with the patient.   Return in about 1 week (around 05/19/2018) for afi/NST/ROB.  Tresea MallJane Hisham Provence, CNM 05/12/2018 3:20 PM

## 2018-05-12 NOTE — OB Triage Note (Signed)
Pt presents to unit via ED c/o leakage of fluids since 2100 and ctx, pain rating 5/10. Upon RN assessment, small amount of clear fluid noted. Denies vaginal bleeding, positive fetal movement. External monitors applied and assessing. Fetal heartrate with baseline of 140s with moderate variability, +accels. RN will continue to monitor.

## 2018-05-12 NOTE — Progress Notes (Signed)
ROB NST/Ultrasound Declined 17P injection

## 2018-05-13 ENCOUNTER — Other Ambulatory Visit: Payer: Self-pay

## 2018-05-13 ENCOUNTER — Ambulatory Visit: Payer: Medicaid Other

## 2018-05-13 ENCOUNTER — Inpatient Hospital Stay: Payer: Medicaid Other | Admitting: Anesthesiology

## 2018-05-13 DIAGNOSIS — Z3A35 35 weeks gestation of pregnancy: Secondary | ICD-10-CM

## 2018-05-13 DIAGNOSIS — O1092 Unspecified pre-existing hypertension complicating childbirth: Secondary | ICD-10-CM

## 2018-05-13 LAB — GROUP B STREP BY PCR: Group B strep by PCR: NEGATIVE

## 2018-05-13 LAB — CBC
HCT: 30.3 % — ABNORMAL LOW (ref 36.0–46.0)
Hemoglobin: 10.4 g/dL — ABNORMAL LOW (ref 12.0–15.0)
MCH: 32.6 pg (ref 26.0–34.0)
MCHC: 34.3 g/dL (ref 30.0–36.0)
MCV: 95 fL (ref 80.0–100.0)
Platelets: 211 10*3/uL (ref 150–400)
RBC: 3.19 MIL/uL — ABNORMAL LOW (ref 3.87–5.11)
RDW: 13.6 % (ref 11.5–15.5)
WBC: 16.7 10*3/uL — ABNORMAL HIGH (ref 4.0–10.5)
nRBC: 0 % (ref 0.0–0.2)

## 2018-05-13 LAB — TYPE AND SCREEN
ABO/RH(D): O POS
Antibody Screen: NEGATIVE

## 2018-05-13 MED ORDER — MISOPROSTOL 200 MCG PO TABS
ORAL_TABLET | ORAL | Status: AC
  Filled 2018-05-13: qty 5

## 2018-05-13 MED ORDER — OXYCODONE HCL 5 MG PO TABS
5.0000 mg | ORAL_TABLET | ORAL | Status: DC | PRN
  Administered 2018-05-13 – 2018-05-14 (×4): 5 mg via ORAL
  Filled 2018-05-13 (×4): qty 1

## 2018-05-13 MED ORDER — LACTATED RINGERS IV SOLN: 500.0000 mL | Freq: Once | INTRAVENOUS | Status: DC

## 2018-05-13 MED ORDER — CARBOPROST TROMETHAMINE 250 MCG/ML IM SOLN
INTRAMUSCULAR | Status: AC
  Filled 2018-05-13: qty 1

## 2018-05-13 MED ORDER — MISOPROSTOL 200 MCG PO TABS: 800.0000 ug | ORAL_TABLET | Freq: Once | ORAL | Status: AC

## 2018-05-13 MED ORDER — EPHEDRINE 5 MG/ML INJ
10.0000 mg | INTRAVENOUS | Status: DC | PRN
  Filled 2018-05-13: qty 2

## 2018-05-13 MED ORDER — METHYLERGONOVINE MALEATE 0.2 MG/ML IJ SOLN
INTRAMUSCULAR | Status: AC
  Filled 2018-05-13: qty 1

## 2018-05-13 MED ORDER — FERROUS SULFATE 325 (65 FE) MG PO TABS
325.0000 mg | ORAL_TABLET | Freq: Two times a day (BID) | ORAL | Status: DC
  Administered 2018-05-13 – 2018-05-15 (×4): 325 mg via ORAL
  Filled 2018-05-13 (×4): qty 1

## 2018-05-13 MED ORDER — WITCH HAZEL-GLYCERIN EX PADS: 1.0000 "application " | MEDICATED_PAD | CUTANEOUS | Status: DC | PRN

## 2018-05-13 MED ORDER — ONDANSETRON HCL 4 MG/2ML IJ SOLN: 4.0000 mg | INTRAMUSCULAR | Status: DC | PRN

## 2018-05-13 MED ORDER — DIPHENHYDRAMINE HCL 50 MG/ML IJ SOLN: 12.5000 mg | INTRAMUSCULAR | Status: DC | PRN

## 2018-05-13 MED ORDER — PRENATAL MULTIVITAMIN CH
1.0000 | ORAL_TABLET | Freq: Every day | ORAL | Status: DC
  Administered 2018-05-13 – 2018-05-15 (×3): 1 via ORAL
  Filled 2018-05-13 (×3): qty 1

## 2018-05-13 MED ORDER — IBUPROFEN 600 MG PO TABS
600.0000 mg | ORAL_TABLET | Freq: Four times a day (QID) | ORAL | Status: DC
  Administered 2018-05-13 – 2018-05-15 (×9): 600 mg via ORAL
  Filled 2018-05-13 (×9): qty 1

## 2018-05-13 MED ORDER — DIBUCAINE 1 % RE OINT: 1.0000 "application " | TOPICAL_OINTMENT | RECTAL | Status: DC | PRN

## 2018-05-13 MED ORDER — PHENYLEPHRINE 40 MCG/ML (10ML) SYRINGE FOR IV PUSH (FOR BLOOD PRESSURE SUPPORT)
80.0000 ug | PREFILLED_SYRINGE | INTRAVENOUS | Status: DC | PRN
  Filled 2018-05-13: qty 10

## 2018-05-13 MED ORDER — METHYLERGONOVINE MALEATE 0.2 MG/ML IJ SOLN
0.2000 mg | Freq: Once | INTRAMUSCULAR | Status: AC
  Administered 2018-05-13: 0.2 mg via INTRAMUSCULAR

## 2018-05-13 MED ORDER — FENTANYL 2.5 MCG/ML W/ROPIVACAINE 0.15% IN NS 100 ML EPIDURAL (ARMC)
12.0000 mL/h | EPIDURAL | Status: DC
  Administered 2018-05-13: 12 mL/h via EPIDURAL

## 2018-05-13 MED ORDER — SERTRALINE HCL 25 MG PO TABS
50.0000 mg | ORAL_TABLET | Freq: Every day | ORAL | Status: DC
  Administered 2018-05-13 – 2018-05-14 (×2): 50 mg via ORAL
  Filled 2018-05-13 (×3): qty 2

## 2018-05-13 MED ORDER — METHYLERGONOVINE MALEATE 0.2 MG PO TABS
0.2000 mg | ORAL_TABLET | Freq: Four times a day (QID) | ORAL | Status: DC
  Administered 2018-05-13 – 2018-05-14 (×3): 0.2 mg via ORAL
  Filled 2018-05-13 (×3): qty 1

## 2018-05-13 MED ORDER — SENNOSIDES-DOCUSATE SODIUM 8.6-50 MG PO TABS
2.0000 | ORAL_TABLET | ORAL | Status: DC
  Administered 2018-05-14 – 2018-05-15 (×2): 2 via ORAL
  Filled 2018-05-13 (×2): qty 2

## 2018-05-13 MED ORDER — COCONUT OIL OIL: 1.0000 "application " | TOPICAL_OIL | Status: DC | PRN

## 2018-05-13 MED ORDER — SODIUM CHLORIDE 0.9 % IV SOLN
INTRAVENOUS | Status: DC | PRN
  Administered 2018-05-13 (×3): 5 mL via EPIDURAL

## 2018-05-13 MED ORDER — FENTANYL 2.5 MCG/ML W/ROPIVACAINE 0.15% IN NS 100 ML EPIDURAL (ARMC)
EPIDURAL | Status: AC
  Filled 2018-05-13: qty 100

## 2018-05-13 MED ORDER — MEASLES, MUMPS & RUBELLA VAC IJ SOLR
0.5000 mL | Freq: Once | INTRAMUSCULAR | Status: AC
  Administered 2018-05-15: 0.5 mL via SUBCUTANEOUS
  Filled 2018-05-13 (×2): qty 0.5

## 2018-05-13 MED ORDER — ONDANSETRON HCL 4 MG PO TABS: 4.0000 mg | ORAL_TABLET | ORAL | Status: DC | PRN

## 2018-05-13 MED ORDER — BENZOCAINE-MENTHOL 20-0.5 % EX AERO: 1.0000 "application " | INHALATION_SPRAY | CUTANEOUS | Status: DC | PRN

## 2018-05-13 MED ORDER — LIDOCAINE HCL (PF) 1 % IJ SOLN
INTRAMUSCULAR | Status: DC | PRN
  Administered 2018-05-13: 1 mL

## 2018-05-13 MED ORDER — SIMETHICONE 80 MG PO CHEW: 80.0000 mg | CHEWABLE_TABLET | ORAL | Status: DC | PRN

## 2018-05-13 NOTE — Anesthesia Procedure Notes (Signed)
Epidural Patient location during procedure: OB Start time: 05/13/2018 1:05 AM End time: 05/13/2018 1:22 AM  Staffing Anesthesiologist: Jovita GammaFitzgerald, Chae Shuster L, MD Performed: anesthesiologist   Preanesthetic Checklist Completed: patient identified, site marked, surgical consent, pre-op evaluation, timeout performed, IV checked, risks and benefits discussed and monitors and equipment checked  Epidural Patient position: sitting Prep: ChloraPrep Patient monitoring: heart rate, continuous pulse ox and blood pressure Approach: midline Location: L4-L5 Injection technique: LOR saline  Needle:  Needle type: Tuohy  Needle gauge: 18 G Needle length: 9 cm and 9 Needle insertion depth: 5 cm Catheter type: closed end flexible Catheter size: 20 Guage Catheter at skin depth: 9 cm Test dose: negative, 1.5% lidocaine with Epi 1:200 K and Other  Assessment Events: blood not aspirated, injection not painful, no injection resistance, negative IV test and no paresthesia  Additional Notes   Patient tolerated the insertion well without complications.Reason for block:procedure for pain

## 2018-05-13 NOTE — Discharge Summary (Signed)
Physician Obstetric Discharge Summary  Patient ID: Kimberly Haas MRN: [id number] DOB/AGE: 01/27/1991 28 y.o.   Date of Admission: 05/12/2018  Date of Discharge: 05/15/2018  Admitting Diagnosis: Preterm Premature rupture of membrane at [redacted]w[redacted]d, premature labor  Secondary Diagnosis: Chronic hypertension   Mode of Delivery: normal spontaneous vaginal delivery 05/13/2018      Discharge Diagnosis: Preterm labor and delivery at 35wk2d, postpartum hemorrhage   Intrapartum Procedures: Atificial rupture of membranes, epidural and pitocin augmentation   Post partum procedures: none  Complications: hemorrhage   Brief Hospital Course  Kimberly Haas is a [id number] who had a SVD at 35wk2d after PPROM on 05/13/2018;  for further details of this delivery, please refer to the delivery note.  Patient had an uncomplicated postpartum course.  By time of discharge on PPD#2, her pain was controlled on oral pain medications; she had appropriate lochia and was ambulating, voiding without difficulty and tolerating regular diet.  She was deemed stable for discharge to home.    Labs: CBC Latest Ref Rng & Units 05/14/2018 05/13/2018 05/12/2018  WBC 4.0 - 10.5 K/uL 15.7(H) 16.7(H) 14.9(H)  Hemoglobin 12.0 - 15.0 g/dL 10.0(L) 10.4(L) 11.6(L)  Hematocrit 36.0 - 46.0 % 28.9(L) 30.3(L) 33.1(L)  Platelets 150 - 400 K/uL 201 211 251   O POS  Physical exam:  Blood pressure 129/83, pulse 98, temperature 98.3 F (36.8 C), temperature source Oral, resp. rate 18, height 5\' 2"  (1.575 m), weight 83 kg, last menstrual period 09/08/2017, SpO2 98 %, unknown if currently breastfeeding. General: alert and no distress Lochia: appropriate Abdomen: soft, NT Uterine Fundus: firm Extremities: No evidence of DVT seen on physical exam. No lower extremity edema.  Discharge Instructions: Per After Visit Summary. Activity: Advance as tolerated. Pelvic rest for 6 weeks.  Also refer to Discharge Instructions Diet:  Regular Medications: Allergies as of 05/15/2018   No Known Allergies     Medication List    STOP taking these medications   Doxylamine-Pyridoxine 10-10 MG Tbec Commonly known as:  DICLEGIS   hydroxyprogesterone caproate 250 mg/mL Oil injection Commonly known as:  MAKENA   oseltamivir 75 MG capsule Commonly known as:  TAMIFLU     TAKE these medications   acetaminophen 500 MG tablet Commonly known as:  TYLENOL Take 2 tablets (1,000 mg total) by mouth every 6 (six) hours as needed for moderate pain.   famotidine 20 MG tablet Commonly known as:  PEPCID Take 1 tablet (20 mg total) by mouth 2 (two) times daily.   metoprolol tartrate 25 MG tablet Commonly known as:  LOPRESSOR Take 25 mg by mouth 1 day or 1 dose.   ZOLOFT 50 MG tablet Generic drug:  sertraline Take 1 tablet by mouth daily.      Outpatient follow up:  Follow-up Information    Nadara Mustard, MD. Schedule an appointment as soon as possible for a visit in 3 day(s).   Specialty:  Obstetrics and Gynecology Why:  For Post Partum, Nexplanon Contact information: [address] [place] [address] [phone]          Postpartum contraception: Nexplanon  Discharged Condition: stable  Discharged to: home   Newborn Data: Baby girl Disposition:home with mother  Apgars: APGAR (1 MIN): 9   APGAR (5 MINS): 9   APGAR (10 MINS):    Baby Feeding: Bottle  Letitia Libra, MD 05/15/2018 7:48 AM

## 2018-05-13 NOTE — Discharge Instructions (Signed)
°Vaginal Delivery, Care After °Refer to this sheet in the next few weeks. These discharge instructions provide you with information on caring for yourself after delivery. Your caregiver may also give you specific instructions. Your treatment has been planned according to the most current medical practices available, but problems sometimes occur. Call your caregiver if you have any problems or questions after you go home. °HOME CARE INSTRUCTIONS °1. Take over-the-counter or prescription medicines only as directed by your caregiver or pharmacist. °2. Do not drink alcohol, especially if you are breastfeeding or taking medicine to relieve pain. °3. Do not smoke tobacco. °4. Continue to use good perineal care. Good perineal care includes: °1. Wiping your perineum from back to front °2. Keeping your perineum clean. °3. You can do sitz baths twice a day, to help keep this area clean °5. Do not use tampons, douche or have sex for 6 weeks °6. Shower only and avoid sitting in submerged water, aside from sitz baths °7. Wear a well-fitting bra that provides breast support. °8. Eat healthy foods. °9. Drink enough fluids to keep your urine clear or pale yellow. °10. Eat high-fiber foods such as whole grain cereals and breads, brown rice, beans, and fresh fruits and vegetables every day. These foods may help prevent or relieve constipation. °11. Avoid constipation with high fiber foods or medications, such as miralax or metamucil °12. Follow your caregiver's recommendations regarding resumption of activities such as climbing stairs, driving, lifting, exercising, or traveling. °13. Talk to your caregiver about resuming sexual activities. Resumption of sexual activities after 6 weeks is dependent upon your risk of infection, your rate of healing, and your comfort and desire to resume sexual activity. °14. Try to have someone help you with your household activities and your newborn for at least a few days after you leave the  hospital. °15. Rest as much as possible. Try to rest or take a nap when your newborn is sleeping. °16. Increase your activities gradually. °17. Keep all of your scheduled postpartum appointments. It is very important to keep your scheduled follow-up appointments. At these appointments, your caregiver will be checking to make sure that you are healing physically and emotionally. °SEEK MEDICAL CARE IF:  °· You are passing large clots from your vagina. Save any clots to show your caregiver. °· You have a foul smelling discharge from your vagina. °· You have trouble urinating. °· You are urinating frequently. °· You have pain when you urinate. °· You have a change in your bowel movements. °· You have increasing redness, pain, or swelling near your vaginal incision (episiotomy) or vaginal tear. °· You have pus draining from your episiotomy or vaginal tear. °· Your episiotomy or vaginal tear is separating. °· You have painful, hard, or reddened breasts. °· You have a severe headache. °· You have blurred vision or see spots. °· You feel sad or depressed. °· You have thoughts of hurting yourself or your newborn. °· You have questions about your care, the care of your newborn, or medicines. °· You are dizzy or light-headed. °· You have a rash. °· You have nausea or vomiting. °· You were breastfeeding and have not had a menstrual period within 12 weeks after you stopped breastfeeding. °· You are not breastfeeding and have not had a menstrual period by the 12th week after delivery. °· You have a fever of 100.5 or more °SEEK IMMEDIATE MEDICAL CARE IF:  °· You have persistent pain. °· You have chest pain. °· You have shortness   of breath. °· You faint. °· You have leg pain. °· You have stomach pain. °· Your vaginal bleeding saturates two or more sanitary pads in 1 hour. °MAKE SURE YOU:  °· Understand these instructions. °· Will watch your condition. °· Will get help right away if you are not doing well or get worse. °Document  Released: 04/12/2000 Document Revised: 08/30/2013 Document Reviewed: 12/11/2011 °ExitCare® Patient Information ©2015 [employer]. This information is not intended to replace advice given to you by your health care provider. Make sure you discuss any questions you have with your health care provider. ° °Sitz Bath °A sitz bath is a warm water bath taken in the sitting position. The water covers only the hips and butt (buttocks). We recommend using one that fits in the toilet, to help with ease of use and cleanliness. It may be used for either healing or cleaning purposes. Sitz baths are also used to relieve pain, itching, or muscle tightening (spasms). The water may contain medicine. Moist heat will help you heal and relax.  °HOME CARE  °Take 3 to 4 sitz baths a day. °18. Fill the bathtub half-full with warm water. °19. Sit in the water and open the drain a little. °20. Turn on the warm water to keep the tub half-full. Keep the water running constantly. °21. Soak in the water for 15 to 20 minutes. °22. After the sitz bath, pat the affected area dry. °GET HELP RIGHT AWAY IF: °You get worse instead of better. Stop the sitz baths if you get worse. °MAKE SURE YOU: °· Understand these instructions. °· Will watch your condition. °· Will get help right away if you are not doing well or get worse. °Document Released: 05/23/2004 Document Revised: 01/08/2012 Document Reviewed: 08/13/2010 °ExitCare® Patient Information ©2015 [employer]. This information is not intended to replace advice given to you by your health care provider. Make sure you discuss any questions you have with your health care provider. ° ° °

## 2018-05-13 NOTE — Lactation Note (Signed)
This note was copied from a baby's chart. Lactation Consultation Note  Patient Name: Kimberly Haas ULAGT'X Date: 05/13/2018 Reason for consult: (P) Initial assessment;1st time breastfeeding   Maternal Data Has patient been taught Hand Expression?: (P) Yes  Feeding Feeding Type: Breast Fed  LATCH Score Latch: Grasps breast easily, tongue down, lips flanged, rhythmical sucking.(using nipple shield)  Audible Swallowing: Spontaneous and intermittent  Type of Nipple: Flat  Comfort (Breast/Nipple): Soft / non-tender  Hold (Positioning): Assistance needed to correctly position infant at breast and maintain latch.  LATCH Score: 8  Interventions Interventions: Assisted with latch;Support pillows  Lactation Tools Discussed/Used     Consult Status Consult Status: (P) Follow-up MOB is able to position baby solo for nursing sessions. LC and Transition nurse have both assisted MOB with latch and positioning. LC spoke with mother about benefits of breastfeeding, what to expect, feeding cues, signs of adequate transfer, and resources.   [facility] tomorrow will f/u with couplet to ensure feedings are still going well.    Burnadette Peter 05/13/2018, 2:17 PM

## 2018-05-13 NOTE — Progress Notes (Signed)
L&D Progress Note 28 year old G2 P0101 presented to L&D after PROM last night around 2100-clear fluid Has progressed from 2.5 to 5 cm. Last exam 2-3 hours ago after epidural placed. GBS negative  S: comfortable after epidural.   O: BP 108/68   Pulse 85   Temp 97.9 F (36.6 C) (Oral)   Resp 14   Ht 5\' 2"  (1.575 m)   Wt 83 kg   LMP 09/08/2017 (Exact Date)   SpO2 96%   BMI 33.47 kg/m   General: lying on side with peanut ball between legs, looks comfortable Abdomen: cephalic presentation. EFW 6# FHR: 135 baseline with accelerations to 150s to 160s, moderate variability Toco: irregular contractions: every 2-6 min apart Cervix: 5/70%/-2 to -3  A: No further cervical change FWB: Cat 1 tracing  P: AROM of forebag Start Pitocin augmentation GBS negative-can discontinue antibiotics Monitor progress and fetal maternal well being.  Farrel Conners, CNM

## 2018-05-13 NOTE — Anesthesia Preprocedure Evaluation (Signed)
Anesthesia Evaluation  Patient identified by MRN, date of birth, ID band Patient awake    Reviewed: Allergy & Precautions, H&P , NPO status , Patient's Chart, lab work & pertinent test results  Airway Mallampati: III  TM Distance: >3 FB     Dental  (+) Chipped   Pulmonary neg pulmonary ROS, former smoker,           Cardiovascular Exercise Tolerance: Good hypertension (chronic),      Neuro/Psych PSYCHIATRIC DISORDERS Depression    GI/Hepatic GERD  ,  Endo/Other    Renal/GU   negative genitourinary   Musculoskeletal   Abdominal   Peds  Hematology negative hematology ROS (+)   Anesthesia Other Findings Past Medical History: No date: Acid reflux     Comment:  occasional; TUMS as needed No date: Depression     Comment:  no current meds. No date: Gall stones No date: Lactating mother     Comment:  instructed to pump and discard breast milk x 24 hours               post-op  Past Surgical History: 03/05/2012: CHOLECYSTECTOMY     Comment:  Procedure: LAPAROSCOPIC CHOLECYSTECTOMY WITH               INTRAOPERATIVE CHOLANGIOGRAM;  Surgeon: Currie Paris, MD;  Location: [facility];                Service: General;  Laterality: N/A;  laparoscopic               cholecystectomy with attempted interoperative               cholangiogram  BMI    Body Mass Index:  33.47 kg/m      Reproductive/Obstetrics (+) Pregnancy                             Anesthesia Physical Anesthesia Plan  ASA: III  Anesthesia Plan: Epidural   Post-op Pain Management:    Induction:   PONV Risk Score and Plan:   Airway Management Planned:   Additional Equipment:   Intra-op Plan:   Post-operative Plan:   Informed Consent: I have reviewed the patients History and Physical, chart, labs and discussed the procedure including the risks, benefits and alternatives for the proposed  anesthesia with the patient or authorized representative who has indicated his/her understanding and acceptance.       Plan Discussed with: Anesthesiologist  Anesthesia Plan Comments:         Anesthesia Quick Evaluation

## 2018-05-14 LAB — RPR, QUANT+TP ABS (REFLEX)
Rapid Plasma Reagin, Quant: 1:1 {titer} — ABNORMAL HIGH
TREPONEMA PALLIDUM AB: NONREACTIVE

## 2018-05-14 LAB — RPR: RPR Ser Ql: REACTIVE — AB

## 2018-05-14 LAB — CBC
HEMATOCRIT: 28.9 % — AB (ref 36.0–46.0)
Hemoglobin: 10 g/dL — ABNORMAL LOW (ref 12.0–15.0)
MCH: 32.9 pg (ref 26.0–34.0)
MCHC: 34.6 g/dL (ref 30.0–36.0)
MCV: 95.1 fL (ref 80.0–100.0)
Platelets: 201 10*3/uL (ref 150–400)
RBC: 3.04 MIL/uL — ABNORMAL LOW (ref 3.87–5.11)
RDW: 13.8 % (ref 11.5–15.5)
WBC: 15.7 10*3/uL — AB (ref 4.0–10.5)
nRBC: 0 % (ref 0.0–0.2)

## 2018-05-14 NOTE — Anesthesia Postprocedure Evaluation (Signed)
Anesthesia Post Note  Patient: Kimberly Haas  Procedure(s) Performed: AN AD HOC LABOR EPIDURAL  Patient location during evaluation: Mother Baby Anesthesia Type: Epidural Level of consciousness: awake, awake and alert, oriented and patient cooperative Pain management: pain level controlled Vital Signs Assessment: post-procedure vital signs reviewed and stable Respiratory status: spontaneous breathing, nonlabored ventilation and respiratory function stable Cardiovascular status: stable Postop Assessment: no headache, no backache, able to ambulate, adequate PO intake, no apparent nausea or vomiting and patient able to bend at knees Anesthetic complications: no     Last Vitals:  Vitals:   05/14/18 0402 05/14/18 0745  BP: 102/71 112/84  Pulse: 79 80  Resp: 20 18  Temp: 36.7 C 36.9 C  SpO2: 99% 98%    Last Pain:  Vitals:   05/14/18 0745  TempSrc: Oral  PainSc:                  Lyn Records

## 2018-05-14 NOTE — Progress Notes (Signed)
PPD#1 SVD Subjective:  Sitting up in bed, feels well. Pitocin is still infusing and she is having some strong afterpains. Pain control is adequate. Voiding without difficulty. Tolerating a regular diet. Ambulating well.  Objective:   Blood pressure 112/84, pulse 80, temperature 98.5 F (36.9 C), temperature source Oral, resp. rate 18, height 5' 2" (1.575 m), weight 83 kg, last menstrual period 09/08/2017, SpO2 98 %, unknown if currently breastfeeding.  General: NAD Pulmonary: no increased work of breathing Abdomen: non-distended, non-tender Uterus:  fundus firm; lochia appripriate Extremities: no signs of DVT  Results for orders placed or performed during the hospital encounter of 05/12/18 (from the past 72 hour(s))  OB RESULT CONSOLE Group B Strep     Status: None   Collection Time: 05/12/18 12:00 AM  Result Value Ref Range   GBS Negative   ROM Plus ([facility] only)     Status: None   Collection Time: 05/12/18 10:39 PM  Result Value Ref Range   Rom Plus POSITIVE     Comment: Performed at [facility], [address], [place], [place] [address]  Protein / creatinine ratio, urine     Status: None   Collection Time: 05/12/18 10:39 PM  Result Value Ref Range   Creatinine, Urine 80 mg/dL   Total Protein, Urine 11 mg/dL    Comment: NO NORMAL RANGE ESTABLISHED FOR THIS TEST   Protein Creatinine Ratio 0.14 0.00 - 0.15 mg/mg[Cre]    Comment: Performed at [facility], [address] [place],  [address]  Urine Drug Screen, Qualitative ([place] only)     Status: None   Collection Time: 05/12/18 10:39 PM  Result Value Ref Range   Tricyclic, Ur Screen NONE DETECTED NONE DETECTED   Amphetamines, Ur Screen NONE DETECTED NONE DETECTED   MDMA (Ecstasy)Ur Screen NONE DETECTED NONE DETECTED   Cocaine Metabolite,Ur [place] NONE DETECTED NONE DETECTED   Opiate, Ur Screen NONE DETECTED NONE DETECTED   Phencyclidine (PCP) Ur S NONE DETECTED NONE DETECTED   Cannabinoid 50 Ng, Ur [place]  NONE DETECTED NONE DETECTED   Barbiturates, Ur Screen NONE DETECTED NONE DETECTED   Benzodiazepine, Ur Scrn NONE DETECTED NONE DETECTED   Methadone Scn, Ur NONE DETECTED NONE DETECTED    Comment: (NOTE) Tricyclics + metabolites, urine    Cutoff 1000 ng/mL Amphetamines + metabolites, urine  Cutoff 1000 ng/mL MDMA (Ecstasy), urine              Cutoff 500 ng/mL Cocaine Metabolite, urine          Cutoff 300 ng/mL Opiate + metabolites, urine        Cutoff 300 ng/mL Phencyclidine (PCP), urine         Cutoff 25 ng/mL Cannabinoid, urine                 Cutoff 50 ng/mL Barbiturates + metabolites, urine  Cutoff 200 ng/mL Benzodiazepine, urine              Cutoff 200 ng/mL Methadone, urine                   Cutoff 300 ng/mL The urine drug screen provides only a preliminary, unconfirmed analytical test result and should not be used for non-medical purposes. Clinical consideration and professional judgment should be applied to any positive drug screen result due to possible interfering substances. A more specific alternate chemical method must be used in order to obtain a confirmed analytical result. Gas chromatography / mass spectrometry (GC/MS) is  the preferred confirmat ory method. Performed at [facility], [address] [place], [place] [address]   CBC     Status: Abnormal   Collection Time: 05/12/18 11:14 PM  Result Value Ref Range   WBC 14.9 (H) 4.0 - 10.5 K/uL   RBC 3.57 (L) 3.87 - 5.11 MIL/uL   Hemoglobin 11.6 (L) 12.0 - 15.0 g/dL   HCT 33.1 (L) 36.0 - 46.0 %   MCV 92.7 80.0 - 100.0 fL   MCH 32.5 26.0 - 34.0 pg   MCHC 35.0 30.0 - 36.0 g/dL   RDW 13.7 11.5 - 15.5 %   Platelets 251 150 - 400 K/uL   nRBC 0.0 0.0 - 0.2 %    Comment: Performed at [facility], [address] [place], [place] [address]  Type and screen Section     Status: None   Collection Time: 05/12/18 11:14 PM  Result Value Ref Range   ABO/RH(D) O POS    Antibody  Screen NEG    Sample Expiration      05/15/2018 Performed at [facility], [address] [place], [place] [address]   Comprehensive metabolic panel     Status: Abnormal   Collection Time: 05/12/18 11:14 PM  Result Value Ref Range   Sodium 135 135 - 145 mmol/L   Potassium 3.1 (L) 3.5 - 5.1 mmol/L   Chloride 106 98 - 111 mmol/L   CO2 21 (L) 22 - 32 mmol/L   Glucose, Bld 70 70 - 99 mg/dL   BUN 5 (L) 6 - 20 mg/dL   Creatinine, Ser 0.38 (L) 0.44 - 1.00 mg/dL   Calcium 8.4 (L) 8.9 - 10.3 mg/dL   Total Protein 6.3 (L) 6.5 - 8.1 g/dL   Albumin 2.6 (L) 3.5 - 5.0 g/dL   AST 22 15 - 41 U/L   ALT 15 0 - 44 U/L   Alkaline Phosphatase 149 (H) 38 - 126 U/L   Total Bilirubin 0.4 0.3 - 1.2 mg/dL   GFR calc non Af Amer >60 >60 mL/min   GFR calc Af Amer >60 >60 mL/min   Anion gap 8 5 - 15    Comment: Performed at [facility], [address] [place], [place] [address]  Group B strep by PCR     Status: None   Collection Time: 05/12/18 11:14 PM  Result Value Ref Range   Group B strep by PCR NEGATIVE NEGATIVE    Comment: (NOTE) Intrapartum testing with Xpert GBS assay should be used as an adjunct to other methods available and not used to replace antepartum testing (at 35-[redacted] weeks gestation). Performed at [facility], [address] [place], [place] [address]   CBC     Status: Abnormal   Collection Time: 05/13/18  2:57 PM  Result Value Ref Range   WBC 16.7 (H) 4.0 - 10.5 K/uL   RBC 3.19 (L) 3.87 - 5.11 MIL/uL   Hemoglobin 10.4 (L) 12.0 - 15.0 g/dL   HCT 30.3 (L) 36.0 - 46.0 %   MCV 95.0 80.0 - 100.0 fL   MCH 32.6 26.0 - 34.0 pg   MCHC 34.3 30.0 - 36.0 g/dL   RDW 13.6 11.5 - 15.5 %   Platelets 211 150 - 400 K/uL   nRBC 0.0 0.0 - 0.2 %    Comment: Performed at [facility], [address] [place],  [address]  CBC     Status: Abnormal   Collection  Time: 05/14/18  5:40 AM  Result Value Ref Range   WBC 15.7 (H) 4.0 - 10.5 K/uL   RBC  3.04 (L) 3.87 - 5.11 MIL/uL   Hemoglobin 10.0 (L) 12.0 - 15.0 g/dL   HCT 28.9 (L) 36.0 - 46.0 %   MCV 95.1 80.0 - 100.0 fL   MCH 32.9 26.0 - 34.0 pg   MCHC 34.6 30.0 - 36.0 g/dL   RDW 13.8 11.5 - 15.5 %   Platelets 201 150 - 400 K/uL   nRBC 0.0 0.0 - 0.2 %    Comment: Performed at [facility], [address] [place], [place] [address]    Assessment:   28 y.o. [phone] postpartum day #1 in good condition.  Plan:   1) Acute blood loss anemia - hemodynamically stable and asymptomatic - PO ferrous sulfate  2) Blood Type --/--/O POS (01/14 2314) /   3) Rubella <0.90 (07/10 7628) / Varicella Immune / TDAP status: received antepartum 04/08/2018 MMR ordered  4) Breast and formula feeding  5) Contraception: plans Nexplanon  6) Disposition: continue postpartum care.  Avel Sensor, CNM 05/14/2018

## 2018-05-15 LAB — SURGICAL PATHOLOGY

## 2018-05-15 NOTE — Clinical Social Work Maternal (Signed)
  CLINICAL SOCIAL WORK MATERNAL/CHILD NOTE  Patient Details  Name: Kimberly Haas MRN: [id number] Date of Birth: June 29, 1990  Date:  05/15/2018  Clinical Social Worker Initiating Note:  York SpanielMonica Marinus Eicher MSW,LCSW Date/Time: Initiated:  05/15/18/      Child's Name:      Biological Parents:  Mother   Need for Interpreter:  None   Reason for Referral:  Other (Comment)(potential drug exposed newborn)   Address:  [address] [place] [id number]    Phone number:  [phone] (home)     Additional phone number: none  Household Members/Support Persons (HM/SP):       HM/SP Name Relationship DOB or Age  HM/SP -1        HM/SP -2        HM/SP -3        HM/SP -4        HM/SP -5        HM/SP -6        HM/SP -7        HM/SP -8          Natural Supports (not living in the home):  Friends   Professional Supports:     Employment:     Type of Work:     Education:      Homebound arranged:    Surveyor, quantityinancial Resources:  Medicaid   Other Resources:  [employer]   Cultural/Religious Considerations Which May Impact Care:  none  Strengths:  Ability to meet basic needs , Compliance with medical plan , Home prepared for child    Psychotropic Medications:         Pediatrician:       [place]:   [place]    [place]    [place]    [place]    [place]    [place]      Pediatrician Fax Number:    Risk Factors/Current Problems:  None   Cognitive State:  Alert , Able to Concentrate    Mood/Affect:  Calm , Bright    CSW Assessment: CSW spoke with patient this afternoon prior to her discharge. CSW introduced self and explained role and purpose of visit. Patient's mother was in the room and patient gave permission to remain in the room during the assessment. Patient informed CSW that she resides with her mother and father. She has all necessities for her newborn and she has another child in the home. Patient reports she did receive  education regarding postpartum depression and states she did not have it with her first child. She takes zoloft for chronic depression and assured CSW that she is able to contact her primary care should she need to. Patient had no further concerns and states she has a good support system.  CSW Plan/Description:  No Further Intervention Required/No Barriers to Discharge    York SpanielMonica Ailey Wessling, LCSW 05/15/2018, 5:50 PM

## 2018-05-15 NOTE — Progress Notes (Signed)
Discharge instructions provided.  Pt and pt's mother verbalize understanding of all instructions and follow-up care.  Pt discharged to home with infant at 1423 on 05/15/18 via wheelchair by nursing student. Reynold BowenSusan Paisley Nasim Garofano, RN 05/15/2018 4:32 PM

## 2018-05-15 NOTE — Progress Notes (Signed)
Pt states that she received TDaP vaccine during pregnancy and Influenza vaccine this season.  Pt declines these vaccines at this time. Reynold Bowen, RN 05/15/2018 10:50 AM

## 2018-05-15 NOTE — Progress Notes (Signed)
Admit Date: 05/12/2018 Today's Date: 05/15/2018  Post Partum Day 2  Subjective:  no complaints, up ad lib, voiding and tolerating PO  Objective: Temp:  [97.8 F (36.6 C)-98.7 F (37.1 C)] 98.3 F (36.8 C) (01/17 0315) Pulse Rate:  [75-103] 98 (01/17 0315) Resp:  [18-20] 18 (01/17 0315) BP: (105-129)/(70-83) 129/83 (01/17 0315) SpO2:  [97 %-100 %] 98 % (01/17 0315)  Physical Exam:  General: alert, cooperative and no distress Lochia: appropriate Uterine Fundus: firm Incision: none DVT Evaluation: No evidence of DVT seen on physical exam. Negative Homan's sign.  Recent Labs    05/13/18 1457 05/14/18 0540  HGB 10.4* 10.0*  HCT 30.3* 28.9*    Assessment/Plan: Discharge home, Contraception (plans Nexplanon), Bottle Feeding and Infant doing well   LOS: 3 days   Kimberly Haas [facility] 05/15/2018, 7:49 AM

## 2018-05-20 ENCOUNTER — Ambulatory Visit: Payer: Medicaid Other

## 2018-05-20 ENCOUNTER — Telehealth: Payer: Self-pay | Admitting: Obstetrics & Gynecology

## 2018-05-20 ENCOUNTER — Encounter: Payer: Medicaid Other | Admitting: Obstetrics & Gynecology

## 2018-05-20 ENCOUNTER — Other Ambulatory Visit: Payer: Medicaid Other

## 2018-05-20 NOTE — Telephone Encounter (Signed)
Patient is schedule 06/03/18 with [facility] for nexplanon insertion

## 2018-05-26 NOTE — Telephone Encounter (Signed)
Noted. Will order to arrive by apt date/time. 

## 2018-06-03 ENCOUNTER — Ambulatory Visit: Payer: Medicaid Other | Admitting: Obstetrics & Gynecology

## 2018-06-03 NOTE — Telephone Encounter (Signed)
Per pt want s to reschedule to 06/25/18 with [facility]

## 2018-06-25 ENCOUNTER — Ambulatory Visit (INDEPENDENT_AMBULATORY_CARE_PROVIDER_SITE_OTHER): Payer: Medicaid Other | Admitting: Obstetrics & Gynecology

## 2018-06-25 ENCOUNTER — Encounter: Payer: Self-pay | Admitting: Obstetrics & Gynecology

## 2018-06-25 DIAGNOSIS — Z3046 Encounter for surveillance of implantable subdermal contraceptive: Secondary | ICD-10-CM | POA: Diagnosis not present

## 2018-06-25 DIAGNOSIS — Z1389 Encounter for screening for other disorder: Secondary | ICD-10-CM

## 2018-06-25 DIAGNOSIS — Z30017 Encounter for initial prescription of implantable subdermal contraceptive: Secondary | ICD-10-CM

## 2018-06-25 NOTE — Patient Instructions (Signed)
Nexplanon Instructions After Insertion  Keep bandage clean and dry for 24 hours  May use ice/Tylenol/Ibuprofen for soreness or pain  If you develop fever, drainage or increased warmth from incision site-contact office immediately   

## 2018-06-25 NOTE — Progress Notes (Signed)
  OBSTETRICS POSTPARTUM CLINIC PROGRESS NOTE  Subjective:     Kimberly Haas is a 28 y.o. [phone] female who presents for a postpartum visit. She is 6 weeks postpartum following a Preterm pregnancy <37 weeks and delivery by Vaginal, no problems at delivery.  I have fully reviewed the prenatal and intrapartum course. Anesthesia: none.  Postpartum course has been complicated by uncomplicated.  Baby is feeding by Bottle.  Bleeding: patient has not  resumed menses.  Bowel function is normal. Bladder function is normal.  Patient is not sexually active. Contraception method desired is Nexplanon.  Postpartum depression screening: negative. Edinburgh 5.  The following portions of the patient's history were reviewed and updated as appropriate: allergies, current medications, past family history, past medical history, past social history, past surgical history and problem list.  Review of Systems Pertinent items are noted in HPI.  Objective:    BP 120/80   Ht 5' 2.5" (1.588 m)   Wt 169 lb (76.7 kg)   BMI 30.42 kg/m   General:  alert and no distress   Breasts:  inspection negative, no nipple discharge or bleeding, no masses or nodularity palpable  Lungs: clear to auscultation bilaterally  Heart:  regular rate and rhythm, S1, S2 normal, no murmur, click, rub or gallop  Abdomen: soft, non-tender; bowel sounds normal; no masses,  no organomegaly.     Vulva:  normal  Vagina: normal vagina, no discharge, exudate, lesion, or erythema  Cervix:  no cervical motion tenderness and no lesions  Corpus: normal size, contour, position, consistency, mobility, non-tender  Adnexa:  normal adnexa and no mass, fullness, tenderness  Rectal Exam: Not performed.        Assessment:  Post Partum Care visit 1. Postpartum care following vaginal delivery  2. Nexplanon insertion  Plan:  See orders and Patient Instructions Follow up in: 3 months or as needed.  PAP then Continue cHTN therapy w Lorpressor, see  PCP for adjustmnents Continue Zoloft  Nexplanon Insertion  Patient given informed consent, signed copy in the chart, time out was performed. Pregnancy test was neg. Appropriate time out taken.  Patient's left arm was prepped and draped in the usual sterile fashion.. The ruler used to measure and mark insertion area.  Pt was prepped with betadine swab and then injected with 3 cc of 2% lidocaine with epinephrine. Nexplanon removed form packaging,  Device confirmed in needle, then inserted full length of needle and withdrawn per handbook instructions.  Pt insertion site covered with steri-strip and a bandage.   Minimal blood loss.  Pt tolerated the procedure well.   Annamarie Major, MD, [facility], [facility] 06/25/2018  10:09 AM

## 2018-09-23 ENCOUNTER — Ambulatory Visit: Payer: Medicaid Other | Admitting: Obstetrics & Gynecology

## 2018-10-19 ENCOUNTER — Ambulatory Visit: Payer: Medicaid Other | Admitting: Obstetrics & Gynecology

## 2018-11-30 ENCOUNTER — Other Ambulatory Visit (HOSPITAL_COMMUNITY)
Admission: RE | Admit: 2018-11-30 | Discharge: 2018-11-30 | Disposition: A | Discharge status: Home / Self Care | Payer: Medicaid Other | Source: Ambulatory Visit | Attending: Obstetrics & Gynecology | Admitting: Obstetrics & Gynecology

## 2018-11-30 ENCOUNTER — Other Ambulatory Visit: Payer: Self-pay

## 2018-11-30 ENCOUNTER — Ambulatory Visit (INDEPENDENT_AMBULATORY_CARE_PROVIDER_SITE_OTHER): Payer: Medicaid Other | Admitting: Obstetrics & Gynecology

## 2018-11-30 ENCOUNTER — Encounter: Payer: Self-pay | Admitting: Obstetrics & Gynecology

## 2018-11-30 VITALS — BP 100/60 | Ht 62.0 in | Wt 184.0 lb

## 2018-11-30 DIAGNOSIS — Z01419 Encounter for gynecological examination (general) (routine) without abnormal findings: Secondary | ICD-10-CM

## 2018-11-30 DIAGNOSIS — Z3046 Encounter for surveillance of implantable subdermal contraceptive: Secondary | ICD-10-CM

## 2018-11-30 DIAGNOSIS — Z124 Encounter for screening for malignant neoplasm of cervix: Secondary | ICD-10-CM | POA: Diagnosis not present

## 2018-11-30 DIAGNOSIS — Z0000 Encounter for general adult medical examination without abnormal findings: Secondary | ICD-10-CM

## 2018-11-30 NOTE — Progress Notes (Signed)
HPI:      Ms. Shelanda Duvall is a 28 y.o. G2P0202 who LMP was Patient's last menstrual period was 11/16/2018., she presents today for her annual examination. The patient has no complaints today. The patient is sexually active. Her last pap: approximate date 2019 and was normal. The patient does perform self breast exams.  There is no notable family history of breast or ovarian cancer in her family.  The patient has regular exercise: yes.  The patient denies current symptoms of depression.    GYN History: Contraception: Nexplanon, irreg but spaced out periods  PMHx: Past Medical History:  Diagnosis Date  . Acid reflux    occasional; TUMS as needed  . Depression    no current meds.  Kennyth Arnold stones   . Lactating mother    instructed to pump and discard breast milk x 24 hours post-op   Past Surgical History:  Procedure Laterality Date  . CHOLECYSTECTOMY  03/05/2012   Procedure: LAPAROSCOPIC CHOLECYSTECTOMY WITH INTRAOPERATIVE CHOLANGIOGRAM;  Surgeon: Haywood Lasso, MD;  Location: [place];  Service: General;  Laterality: N/A;  laparoscopic cholecystectomy with attempted interoperative cholangiogram   History reviewed. No pertinent family history. Social History   Tobacco Use  . Smoking status: Former Smoker    Packs/day: 0.15    Years: 8.00    Pack years: 1.20    Types: Cigarettes    Quit date: 04/29/2018    Years since quitting: 0.5  . Smokeless tobacco: Never Used  Substance Use Topics  . Alcohol use: No    Comment: h/o EtOH use, but not now  . Drug use: No    Current Outpatient Medications:  .  etonogestrel (NEXPLANON) 68 MG IMPL implant, 1 each by Subdermal route once., Disp: , Rfl:  .  famotidine (PEPCID) 20 MG tablet, Take 1 tablet (20 mg total) by mouth 2 (two) times daily., Disp: 60 tablet, Rfl: 1 .  metoprolol tartrate (LOPRESSOR) 25 MG tablet, Take 25 mg by mouth 1 day or 1 dose., Disp: , Rfl:  .  sertraline (ZOLOFT) 50 MG tablet, Take 1 tablet  by mouth daily., Disp: , Rfl:  Allergies: Patient has no known allergies.  Review of Systems  Constitutional: Negative for chills, fever and malaise/fatigue.  HENT: Negative for congestion, sinus pain and sore throat.   Eyes: Negative for blurred vision and pain.  Respiratory: Negative for cough and wheezing.   Cardiovascular: Negative for chest pain and leg swelling.  Gastrointestinal: Negative for abdominal pain, constipation, diarrhea, heartburn, nausea and vomiting.  Genitourinary: Negative for dysuria, frequency, hematuria and urgency.  Musculoskeletal: Negative for back pain, joint pain, myalgias and neck pain.  Skin: Negative for itching and rash.  Neurological: Negative for dizziness, tremors and weakness.  Endo/Heme/Allergies: Does not bruise/bleed easily.  Psychiatric/Behavioral: Negative for depression. The patient is not nervous/anxious and does not have insomnia.     Objective: BP 100/60   Ht 5\' 2"  (1.575 m)   Wt 184 lb (83.5 kg)   LMP 11/16/2018   BMI 33.65 kg/m   Filed Weights   11/30/18 1538  Weight: 184 lb (83.5 kg)   Body mass index is 33.65 kg/m. Physical Exam Constitutional:      General: She is not in acute distress.    Appearance: She is well-developed.  Genitourinary:     Pelvic exam was performed with patient supine.     Vagina, uterus and rectum normal.     No lesions in the vagina.  No vaginal bleeding.     No cervical motion tenderness, friability, lesion or polyp.     Uterus is mobile.     Uterus is not enlarged.     No uterine mass detected.    Uterus is midaxial.     No right or left adnexal mass present.     Right adnexa not tender.     Left adnexa not tender.  HENT:     Head: Normocephalic and atraumatic. No laceration.     Right Ear: Hearing normal.     Left Ear: Hearing normal.     Mouth/Throat:     Pharynx: Uvula midline.  Eyes:     Pupils: Pupils are equal, round, and reactive to light.  Neck:     Musculoskeletal: Normal  range of motion and neck supple.     Thyroid: No thyromegaly.  Cardiovascular:     Rate and Rhythm: Normal rate and regular rhythm.     Heart sounds: No murmur. No friction rub. No gallop.   Pulmonary:     Effort: Pulmonary effort is normal. No respiratory distress.     Breath sounds: Normal breath sounds. No wheezing.  Chest:     Breasts:        Right: No mass, skin change or tenderness.        Left: No mass, skin change or tenderness.  Abdominal:     General: Bowel sounds are normal. There is no distension.     Palpations: Abdomen is soft.     Tenderness: There is no abdominal tenderness. There is no rebound.  Musculoskeletal: Normal range of motion.       Arms:  Neurological:     Mental Status: She is alert and oriented to person, place, and time.     Cranial Nerves: No cranial nerve deficit.  Skin:    General: Skin is warm and dry.  Psychiatric:        Judgment: Judgment normal.  Vitals signs reviewed.     Assessment:  ANNUAL EXAM 1. Women's annual routine gynecological examination   2. Screening for cervical cancer   3. Surveillance of implantable subdermal contraceptive      Screening Plan:            1.  Cervical Screening-  Pap smear done today  2. Labs managed by PCP  3. Counseling for contraception: Nexplanon  Surveillance of implantable subdermal contraceptive Continue for full three years No side effects   4. Cont Zoloft and Lopressor, per PCP     F/U  Return in about 1 year (around 11/30/2019) for Annual.  Annamarie MajorPaul Chloe Miyoshi, MD, [facility] Frederick[facility]AC[facility], [facility] 11/30/2018  4:01 PM

## 2018-12-02 LAB — CYTOLOGY - PAP: Diagnosis: NEGATIVE

## 2019-06-25 ENCOUNTER — Other Ambulatory Visit: Payer: Self-pay

## 2019-06-25 ENCOUNTER — Emergency Department
Admission: EM | Admit: 2019-06-25 | Discharge: 2019-06-25 | Disposition: A | Discharge status: Home / Self Care | Payer: Medicaid Other | Attending: Emergency Medicine | Admitting: Emergency Medicine

## 2019-06-25 ENCOUNTER — Emergency Department: Payer: Medicaid Other

## 2019-06-25 DIAGNOSIS — T401X1A Poisoning by heroin, accidental (unintentional), initial encounter: Secondary | ICD-10-CM

## 2019-06-25 DIAGNOSIS — D72829 Elevated white blood cell count, unspecified: Secondary | ICD-10-CM

## 2019-06-25 DIAGNOSIS — Z87891 Personal history of nicotine dependence: Secondary | ICD-10-CM | POA: Insufficient documentation

## 2019-06-25 DIAGNOSIS — R4182 Altered mental status, unspecified: Secondary | ICD-10-CM | POA: Diagnosis present

## 2019-06-25 LAB — URINE DRUG SCREEN, QUALITATIVE (ARMC ONLY)
Amphetamines, Ur Screen: NOT DETECTED
Barbiturates, Ur Screen: NOT DETECTED
Benzodiazepine, Ur Scrn: NOT DETECTED
Cannabinoid 50 Ng, Ur ~~LOC~~: NOT DETECTED
Cocaine Metabolite,Ur ~~LOC~~: NOT DETECTED
MDMA (Ecstasy)Ur Screen: NOT DETECTED
Methadone Scn, Ur: NOT DETECTED
Opiate, Ur Screen: POSITIVE — AB
Phencyclidine (PCP) Ur S: NOT DETECTED
Tricyclic, Ur Screen: NOT DETECTED

## 2019-06-25 LAB — CBC WITH DIFFERENTIAL/PLATELET
Abs Immature Granulocytes: 0.38 10*3/uL — ABNORMAL HIGH (ref 0.00–0.07)
Basophils Absolute: 0.1 10*3/uL (ref 0.0–0.1)
Basophils Relative: 0 %
Eosinophils Absolute: 0 10*3/uL (ref 0.0–0.5)
Eosinophils Relative: 0 %
HCT: 42.7 % (ref 36.0–46.0)
Hemoglobin: 14.7 g/dL (ref 12.0–15.0)
Immature Granulocytes: 1 %
Lymphocytes Relative: 5 %
Lymphs Abs: 1.7 10*3/uL (ref 0.7–4.0)
MCH: 32.3 pg (ref 26.0–34.0)
MCHC: 34.4 g/dL (ref 30.0–36.0)
MCV: 93.8 fL (ref 80.0–100.0)
Monocytes Absolute: 2.9 10*3/uL — ABNORMAL HIGH (ref 0.1–1.0)
Monocytes Relative: 9 %
Neutro Abs: 27.1 10*3/uL — ABNORMAL HIGH (ref 1.7–7.7)
Neutrophils Relative %: 85 %
Platelets: 490 10*3/uL — ABNORMAL HIGH (ref 150–400)
RBC: 4.55 MIL/uL (ref 3.87–5.11)
RDW: 12.3 % (ref 11.5–15.5)
Smear Review: NORMAL
WBC: 32.2 10*3/uL — ABNORMAL HIGH (ref 4.0–10.5)
nRBC: 0 % (ref 0.0–0.2)

## 2019-06-25 LAB — COMPREHENSIVE METABOLIC PANEL
ALT: 42 U/L (ref 0–44)
AST: 53 U/L — ABNORMAL HIGH (ref 15–41)
Albumin: 4.4 g/dL (ref 3.5–5.0)
Alkaline Phosphatase: 71 U/L (ref 38–126)
Anion gap: 9 (ref 5–15)
BUN: 12 mg/dL (ref 6–20)
CO2: 23 mmol/L (ref 22–32)
Calcium: 8.6 mg/dL — ABNORMAL LOW (ref 8.9–10.3)
Chloride: 107 mmol/L (ref 98–111)
Creatinine, Ser: 0.76 mg/dL (ref 0.44–1.00)
GFR calc Af Amer: >60 mL/min (ref >60)
GFR calc non Af Amer: >60 mL/min (ref >60)
Glucose, Bld: 109 mg/dL — ABNORMAL HIGH (ref 70–99)
Potassium: 4 mmol/L (ref 3.5–5.1)
Sodium: 139 mmol/L (ref 135–145)
Total Bilirubin: 0.5 mg/dL (ref 0.3–1.2)
Total Protein: 8.2 g/dL — ABNORMAL HIGH (ref 6.5–8.1)

## 2019-06-25 LAB — URINALYSIS, COMPLETE (UACMP) WITH MICROSCOPIC
Bacteria, UA: NONE SEEN
Bilirubin Urine: NEGATIVE
Glucose, UA: >=500 mg/dL — AB
Ketones, ur: NEGATIVE mg/dL
Nitrite: NEGATIVE
Protein, ur: 30 mg/dL — AB
Specific Gravity, Urine: 1.014 (ref 1.005–1.030)
pH: 5 (ref 5.0–8.0)

## 2019-06-25 LAB — PREGNANCY, URINE: Preg Test, Ur: NEGATIVE

## 2019-06-25 LAB — ETHANOL: Alcohol, Ethyl (B): <10 mg/dL (ref <10)

## 2019-06-25 MED ORDER — SODIUM CHLORIDE 0.9 % IV BOLUS
1000.0000 mL | Freq: Once | INTRAVENOUS | Status: AC
  Administered 2019-06-25: 16:00:00 1000 mL via INTRAVENOUS

## 2019-06-25 NOTE — ED Notes (Signed)
Pt is requesting in-pt detox referral, due to concerns about follow through on out-pt services. Pt also is requesting FMLA for her work so she can follow through with an in-pt detox

## 2019-06-25 NOTE — Discharge Instructions (Signed)
Stop using heroin.   Stay hydrated   Your white blood cell count is elevated. Recheck with your doctor in a week   See outpatient detox center for detox   Return to ER if you overdose, thoughts of harming yourself or others, hallucinations

## 2019-06-25 NOTE — BH Assessment (Signed)
Writer spoke with patient about her options for detox facilities and she was in agreement with the plan.  Writer spoke with; [facility] [phone]), No beds [facility] ([phone]), No beds [facility] [phone]), have possible beds.  Writer spoke with the patient again to have her sign a consent for release of information but she stated she didn't want to go to [facility]. Her brother went there and had a bad experience.  Writer discussed the only option at this time is to follow with the facilities from home. Patient voice her understanding. Writer provided her with the contact information for both inpatient and outpatient treatment facilities.  Writer updated ER MD (Dr. Silverio Lay).
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
## 2019-06-25 NOTE — ED Notes (Signed)
Verbal permission granted to discuss care and planning with pt mother, Kimberly Haas

## 2019-06-25 NOTE — ED Provider Notes (Signed)
[facility] EMERGENCY DEPARTMENT Provider Note   CSN: [id number] Arrival date & time: 06/25/19  1401     History Chief Complaint  Patient presents with  . Drug Overdose    Kimberly Haas is a 29 y.o. female hx of depression, heroin abuse here presenting with altered mental status.  Patient was at work and did admit to snorting some heroin.  She is found to be altered at work and EMS was called.  Patient was noted to be cyanotic and heart rate in the 30s and somnolent .  Patient was given 2 mg Narcan prior to arrival and is now awake and alert.  Patient states that she has not been sleeping well recently because her kids are keeping her up at night.  She denies any thoughts of harming herself or others.  She states that she only does heroin occasionally and sometimes uses Percocets as well that were not prescribed to her.  Patient had a previous episode where she required CPR after her overdose.  She adamantly denies being depressed or hallucinations.  She states that she wants help with her addiction.   The history is provided by the patient.       Past Medical History:  Diagnosis Date  . Acid reflux    occasional; TUMS as needed  . Depression    no current meds.  Kennyth Arnold stones   . Lactating mother    instructed to pump and discard breast milk x 24 hours post-op    Patient Active Problem List   Diagnosis Date Noted  . Postpartum care following vaginal delivery 05/13/2018  . BMI 32.0-32.9,adult 04/08/2018  . History of preterm delivery 12/03/2017  . Chronic hypertension affecting pregnancy 10/23/2017    Past Surgical History:  Procedure Laterality Date  . CHOLECYSTECTOMY  03/05/2012   Procedure: LAPAROSCOPIC CHOLECYSTECTOMY WITH INTRAOPERATIVE CHOLANGIOGRAM;  Surgeon: Haywood Lasso, MD;  Location: [facility];  Service: General;  Laterality: N/A;  laparoscopic cholecystectomy with attempted interoperative cholangiogram     OB History     Gravida  2   Para  2   Term      Preterm  2   AB      Living  2     SAB      TAB      Ectopic      Multiple  0   Live Births  2           No family history on file.  Social History   Tobacco Use  . Smoking status: Former Smoker    Packs/day: 0.15    Years: 8.00    Pack years: 1.20    Types: Cigarettes    Quit date: 04/29/2018    Years since quitting: 1.1  . Smokeless tobacco: Never Used  Substance Use Topics  . Alcohol use: No    Comment: h/o EtOH use, but not now  . Drug use: No    Home Medications Prior to Admission medications   Medication Sig Start Date End Date Taking? Authorizing Provider  etonogestrel (NEXPLANON) 68 MG IMPL implant 1 each by Subdermal route once.    [provider]  famotidine (PEPCID) 20 MG tablet Take 1 tablet (20 mg total) by mouth 2 (two) times daily. 04/24/18   Dalia Heading, CNM  metoprolol tartrate (LOPRESSOR) 25 MG tablet Take 25 mg by mouth 1 day or 1 dose.    [provider]  sertraline (ZOLOFT) 50 MG tablet  Take 1 tablet by mouth daily. 07/09/17 07/09/18  [provider]    Allergies    Patient has no known allergies.  Review of Systems   Review of Systems  Psychiatric/Behavioral: Positive for confusion.  All other systems reviewed and are negative.   Physical Exam Updated Vital Signs BP 95/60   Pulse 98   Temp 98.4 F (36.9 C)   Resp 16   Ht 5\' 2"  (1.575 m)   Wt 83.5 kg   SpO2 97%   BMI 33.65 kg/m   Physical Exam Vitals and nursing note reviewed.  Constitutional:      Comments: Slightly sleepy but answering questions   HENT:     Head: Normocephalic.     Nose: Nose normal.     Mouth/Throat:     Mouth: Mucous membranes are dry.  Eyes:     Extraocular Movements: Extraocular movements intact.     Pupils: Pupils are equal, round, and reactive to light.  Cardiovascular:     Rate and Rhythm: Normal rate and regular rhythm.     Pulses: Normal pulses.     Heart  sounds: Normal heart sounds.  Pulmonary:     Effort: Pulmonary effort is normal.     Breath sounds: Normal breath sounds.  Abdominal:     General: Abdomen is flat.     Palpations: Abdomen is soft.  Musculoskeletal:        General: Normal range of motion.     Cervical back: Normal range of motion.  Skin:    General: Skin is warm.     Capillary Refill: Capillary refill takes less than 2 seconds.  Neurological:     Mental Status: She is alert.     Comments: Tired, A & O x 3, nl strength throughout   Psychiatric:     Comments: Slightly depressed, not suicidal      ED Results / Procedures / Treatments   Labs (all labs ordered are listed, but only abnormal results are displayed) Labs Reviewed  CBC WITH DIFFERENTIAL/PLATELET - Abnormal; Notable for the following components:      Result Value   WBC 32.2 (*)    Platelets 490 (*)    All other components within normal limits  PREGNANCY, URINE  COMPREHENSIVE METABOLIC PANEL  ETHANOL  URINE DRUG SCREEN, QUALITATIVE ([facility] ONLY)    EKG None  Radiology No results found.  Procedures Procedures (including critical care time)  Medications Ordered in ED Medications  sodium chloride 0.9 % bolus 1,000 mL (1,000 mLs Intravenous New Bag/Given 06/25/19 1551)    ED Course  I have reviewed the triage vital signs and the nursing notes.  Pertinent labs & imaging results that were available during my care of the patient were reviewed by me and considered in my medical decision making (see chart for details).    MDM Rules/Calculators/A&P                      Kimberly Haas is a 29 y.o. female presenting with altered mental status likely secondary to heroin overdose.  Patient is more awake now.  Admits to using heroin occasionally.  Will get labs and tox and hydrate and observe patient.  Patient is not suicidal homicidal so does not need psych admission currently.  She is amenable to outpatient drug rehab.  5:50 PM WBC is 32. But has no  fever. UA and CXR clear. I think likely stress reaction. I don't think she is septic. She is  requesting inpatient rehab. Will consult TTS to look into rehab.    6:47 PM Kimberly Haas tried to arrange for inpatient rehab but there are no available places. Patient awake and alert. He gave her list of resources for outpatient rehab facilities. Told her to stop using heroin and follow up with PCP   Final Clinical Impression(s) / ED Diagnoses Final diagnoses:  None    Rx / DC Orders ED Discharge Orders    None       Charlynne Pander, MD 06/25/19 [place]

## 2019-06-25 NOTE — ED Triage Notes (Signed)
Pt arrives via [facility] from home with complaint of heroin overdose. Pt was unresponsive on EMS arrival, RR 2 HR 30's. Pt was cyanotic. EMS provided care on scene for approx 2 hours before arrival, pt was initially going to refuse transport. Pt received 3mg  narcan prior to EMS arrival. Pt stable on arrival, responsive, NAD at this time.

## 2019-07-19 NOTE — Telephone Encounter (Signed)
Nexplanon rcvd/charged 06/25/2018

## 2021-03-14 IMAGING — DX DG CHEST 1V PORT
1 series · 1 of 1 positions shown · non-contrast
Comparison: None.

CLINICAL DATA: Heroin overdose

EXAM:
PORTABLE CHEST 1 VIEW

[chest ap]
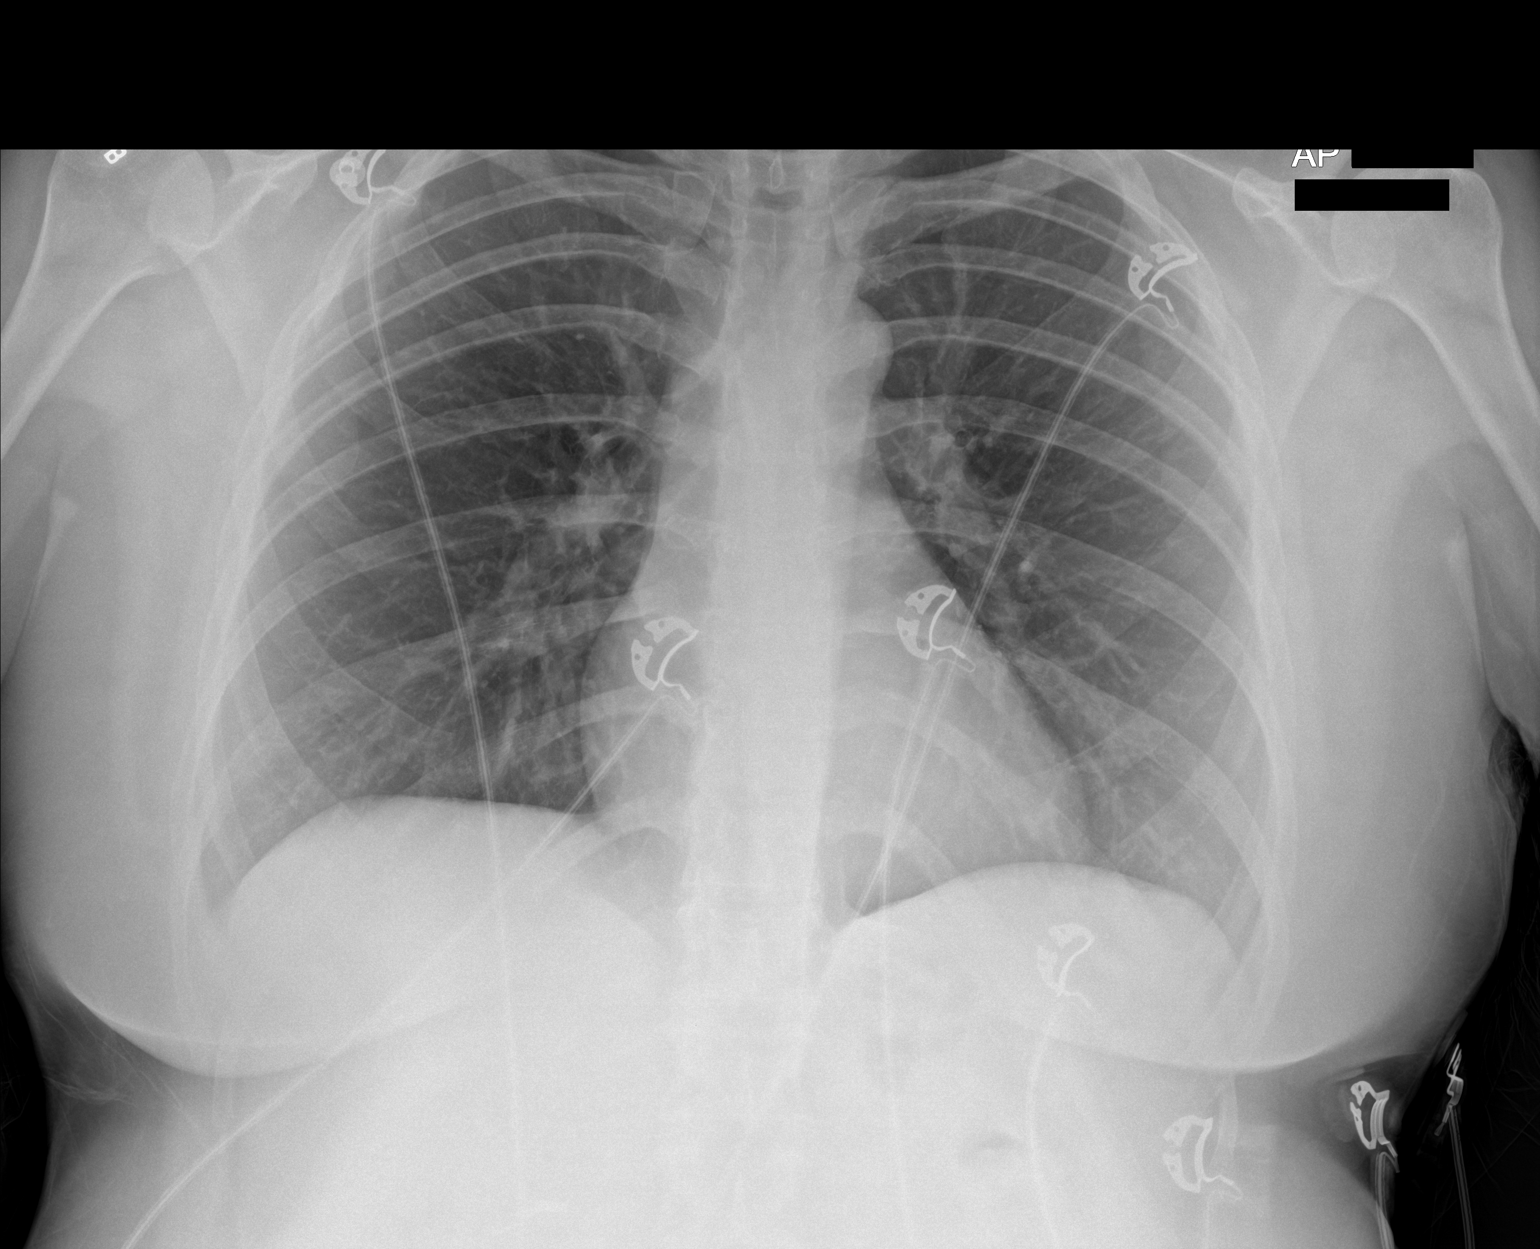

[1 of 1 positions shown; findings below may reference images not displayed]

FINDINGS: Lungs are clear. Heart size and pulmonary vascularity are normal. No
adenopathy. No pneumomediastinum or pneumothorax. No bone lesions.
IMPRESSION: No abnormality noted.

## 2021-07-03 ENCOUNTER — Telehealth: Payer: Self-pay | Admitting: Obstetrics and Gynecology

## 2021-07-03 NOTE — Telephone Encounter (Signed)
Noted. Nexplanon reserved for this patient. 

## 2021-07-03 NOTE — Telephone Encounter (Signed)
Pt is scheduled for Nexplanon removal and reinsertion with ABC on 3/8. ?

## 2021-07-03 NOTE — Progress Notes (Signed)
? ? ? ? ?Chilton Greathouse, NP ? ? ?Chief Complaint  ?Patient presents with  ? Contraception  ?  Nexplanon replacement  ? ? ?HPI: ?     Ms. Kimberly Haas is a 31 y.o. G2P0202 whose LMP was Patient's last menstrual period was 06/28/2021 (approximate)., presents today for nexplanon replacement and pap smear. Has had 3 nexplanons so far. Nexplanon placed 06/25/18. Menses are monthly, lasting 5-7 days, no BTB, mild dysmen, improved with NSAIDs.  ?Neg pap 11/30/18 ? ? ?Patient Active Problem List  ? Diagnosis Date Noted  ? Postpartum care following vaginal delivery 05/13/2018  ? BMI 32.0-32.9,adult 04/08/2018  ? History of preterm delivery 12/03/2017  ? Chronic hypertension affecting pregnancy 10/23/2017  ? ? ?Past Surgical History:  ?Procedure Laterality Date  ? CHOLECYSTECTOMY  03/05/2012  ? Procedure: LAPAROSCOPIC CHOLECYSTECTOMY WITH INTRAOPERATIVE CHOLANGIOGRAM;  Surgeon: Currie Paris, MD;  [facility];  Service: General;  Laterality: N/A;  laparoscopic cholecystectomy with attempted interoperative cholangiogram  ? ? ?History reviewed. No pertinent family history. ? ?Social History  ? ?Socioeconomic History  ? Marital status: Single  ?  Spouse name: Not on file  ? Number of children: Not on file  ? Years of education: Not on file  ? Highest education level: Not on file  ?Occupational History  ? Occupation: CSR  ?  Employer: OTHER  ?  Comment: [employer]  ?Tobacco Use  ? Smoking status: Former  ?  Packs/day: 0.15  ?  Years: 8.00  ?  Pack years: 1.20  ?  Types: Cigarettes  ?  Quit date: 04/29/2018  ?  Years since quitting: 3.1  ? Smokeless tobacco: Never  ?Vaping Use  ? Vaping Use: Never used  ?Substance and Sexual Activity  ? Alcohol use: No  ?  Comment: h/o EtOH use, but not now  ? Drug use: No  ? Sexual activity: Yes  ?  Birth control/protection: Implant  ?Other Topics Concern  ? Not on file  ?Social History Narrative  ? Not on file  ? ?Social Determinants of Health  ? ?Financial  Resource Strain: Not on file  ?Food Insecurity: Not on file  ?Transportation Needs: Not on file  ?Physical Activity: Not on file  ?Stress: Not on file  ?Social Connections: Not on file  ?Intimate Partner Violence: Not on file  ? ? ?Outpatient Medications Prior to Visit  ?Medication Sig Dispense Refill  ? famotidine (PEPCID) 20 MG tablet Take 1 tablet (20 mg total) by mouth 2 (two) times daily. 60 tablet 1  ? metoprolol tartrate (LOPRESSOR) 25 MG tablet Take 25 mg by mouth 1 day or 1 dose.    ? etonogestrel (NEXPLANON) 68 MG IMPL implant 1 each by Subdermal route once.    ? sertraline (ZOLOFT) 50 MG tablet Take 1 tablet by mouth daily.    ? ?No facility-administered medications prior to visit.  ? ? ? ? ?ROS: ? ?Review of Systems  ?Constitutional:  Negative for fever.  ?Gastrointestinal:  Negative for blood in stool, constipation, diarrhea, nausea and vomiting.  ?Genitourinary:  Negative for dyspareunia, dysuria, flank pain, frequency, hematuria, urgency, vaginal bleeding, vaginal discharge and vaginal pain.  ?Musculoskeletal:  Negative for back pain.  ?Skin:  Negative for rash.  ?BREAST: No symptoms ? ? ?OBJECTIVE:  ? ?Vitals:  ?BP 138/72   Ht 5' 2.5" (1.588 m)   Wt 132 lb (59.9 kg)   LMP 06/28/2021 (Approximate)   Breastfeeding No   BMI 23.76 kg/m?  ? ?  Physical Exam ?Vitals reviewed.  ?Constitutional:   ?   Appearance: She is well-developed.  ?Pulmonary:  ?   Effort: Pulmonary effort is normal.  ?Genitourinary: ?   General: Normal vulva.  ?   Pubic Area: No rash.   ?   Labia:     ?   Right: No rash, tenderness or lesion.     ?   Left: No rash, tenderness or lesion.   ?   Vagina: Normal. No vaginal discharge, erythema or tenderness.  ?   Cervix: Normal.  ?   Uterus: Normal. Not enlarged and not tender.   ?   Adnexa: Right adnexa normal and left adnexa normal.    ?   Right: No mass or tenderness.      ?   Left: No mass or tenderness.    ?Musculoskeletal:     ?   General: Normal range of motion.  ?   Cervical  back: Normal range of motion.  ?Skin: ?   General: Skin is warm and dry.  ?Neurological:  ?   General: No focal deficit present.  ?   Mental Status: She is alert and oriented to person, place, and time.  ?Psychiatric:     ?   Mood and Affect: Mood normal.     ?   Behavior: Behavior normal.     ?   Thought Content: Thought content normal.     ?   Judgment: Judgment normal.  ? ? ?Results: ?Results for orders placed or performed in visit on 07/04/21 (from the past 24 hour(s))  ?POCT urine pregnancy     Status: Normal  ? Collection Time: 07/04/21 10:28 AM  ?Result Value Ref Range  ? Preg Test, Ur Negative Negative  ? ? ?Nexplanon removal ?Procedure note - The Nexplanon was noted in the patient's arm and the end was identified. The skin was cleansed with a Betadine solution. A small injection of subcutaneous lidocaine with epinephrine was given over the end of the implant. An incision was made at the end of the implant. The rod was noted in the incision and grasped with a hemostat. It was noted to be intact.  Steri-Strip was placed approximating the incision. Hemostasis was noted. ? ?Nexplanon Insertion ? ?Patient given informed consent, signed copy in the chart, time out was performed. Pregnancy test was neg. ?Appropriate time out taken.  Patient's LEFT arm was prepped and draped in the usual sterile fashion. The ruler used to measure and mark insertion area.  Pt was prepped with betadine swab and then injected with 1.0 cc of 2% lidocaine with epinephrine. Nexplanon removed form packaging,  Device confirmed in needle, then inserted full length of needle and withdrawn per handbook instructions.  Pt insertion site covered with steri-strip and a bandage.   Minimal blood loss.  Pt tolerated the procedure well. ? ?Assessment/Plan: ?Cervical cancer screening - Plan: Cytology - PAP ? ?Screening for HPV (human papillomavirus) - Plan: Cytology - PAP ? ?Encounter for removal and reinsertion of Nexplanon - Plan: POCT urine  pregnancy, etonogestrel (NEXPLANON) 68 MG IMPL implant ? ?She was told to remove the dressing in 12-24 hours, to keep the incision area dry for 24 hours and to remove the Steristrip in 2-3  days.  ?Notify us if any signs of tenderness, redness, pain, or fevers develop. ? ?Meds ordered this encounter  ?Medications  ? etonogestrel (NEXPLANON) 68 MG IMPL implant  ?  Sig: 1 each (68 mg total) by Subdermal route once  for 1 dose.  ?  Dispense:  1 each  ?  Refill:  0  ?  Order Specific Question:   Supervising Provider  ?  AnswerNadara Mustard [[id number]]  ? ? ? ? Return in about 1 year (around 07/05/2022) for annual. ? ?Micheal Murad B. [place], PA-C ?07/04/2021 ?10:30 AM ? ? ?

## 2021-07-04 ENCOUNTER — Other Ambulatory Visit: Payer: Self-pay

## 2021-07-04 ENCOUNTER — Encounter: Payer: Self-pay | Admitting: Obstetrics and Gynecology

## 2021-07-04 ENCOUNTER — Other Ambulatory Visit (HOSPITAL_COMMUNITY)
Admission: RE | Admit: 2021-07-04 | Discharge: 2021-07-04 | Disposition: A | Discharge status: Home / Self Care | Payer: Medicaid Other | Source: Ambulatory Visit | Attending: Obstetrics and Gynecology | Admitting: Obstetrics and Gynecology

## 2021-07-04 ENCOUNTER — Ambulatory Visit (INDEPENDENT_AMBULATORY_CARE_PROVIDER_SITE_OTHER): Payer: Medicaid Other | Admitting: Obstetrics and Gynecology

## 2021-07-04 VITALS — BP 138/72 | Ht 62.5 in | Wt 132.0 lb

## 2021-07-04 DIAGNOSIS — Z3046 Encounter for surveillance of implantable subdermal contraceptive: Secondary | ICD-10-CM

## 2021-07-04 DIAGNOSIS — Z124 Encounter for screening for malignant neoplasm of cervix: Secondary | ICD-10-CM | POA: Diagnosis present

## 2021-07-04 DIAGNOSIS — Z1151 Encounter for screening for human papillomavirus (HPV): Secondary | ICD-10-CM

## 2021-07-04 LAB — POCT URINE PREGNANCY: Preg Test, Ur: NEGATIVE

## 2021-07-04 MED ORDER — NEXPLANON 68 MG ~~LOC~~ IMPL: 1.0000 | DRUG_IMPLANT | Freq: Once | SUBCUTANEOUS | 0 refills | Status: AC

## 2021-07-04 NOTE — Patient Instructions (Signed)
I value your feedback and you entrusting us with your care. If you get a [facility] patient survey, I would appreciate you taking the time to let us know about your experience today. Thank you!  Remove the dressing in 24 hours,  keep the incision area dry for 24 hours and remove the Steristrip in 2-3  days.  Notify us if any signs of tenderness, redness, pain, or fevers develop.   

## 2021-07-06 LAB — CYTOLOGY - PAP
Comment: NEGATIVE
Diagnosis: NEGATIVE
High risk HPV: POSITIVE — AB

## 2021-08-10 NOTE — Telephone Encounter (Signed)
Nexplanon rcvd/charged 07/04/2021 ?
